# Patient Record
Sex: Male | Born: 1960 | Race: White | Hispanic: No | Marital: Single | Smoking: Light tobacco smoker
Health system: Southern US, Community
[De-identification: ages and names within clinical notes are randomized; demographics above are authoritative.]

## PROBLEM LIST (undated history)

## (undated) DIAGNOSIS — E119 Type 2 diabetes mellitus without complications: Secondary | ICD-10-CM

## (undated) DIAGNOSIS — I6529 Occlusion and stenosis of unspecified carotid artery: Secondary | ICD-10-CM

## (undated) DIAGNOSIS — I10 Essential (primary) hypertension: Secondary | ICD-10-CM

## (undated) DIAGNOSIS — J449 Chronic obstructive pulmonary disease, unspecified: Secondary | ICD-10-CM

## (undated) HISTORY — DX: Occlusion and stenosis of unspecified carotid artery: I65.29

---

## 1998-03-30 ENCOUNTER — Encounter: Admission: RE | Admit: 1998-03-30 | Discharge: 1998-03-30 | Payer: Self-pay | Admitting: *Deleted

## 2002-03-05 HISTORY — PX: LEG SURGERY: SHX1003

## 2013-09-21 ENCOUNTER — Emergency Department (HOSPITAL_COMMUNITY): Payer: Self-pay

## 2013-09-21 ENCOUNTER — Encounter (HOSPITAL_COMMUNITY): Payer: Self-pay | Admitting: Emergency Medicine

## 2013-09-21 ENCOUNTER — Inpatient Hospital Stay (HOSPITAL_COMMUNITY)
Admission: EM | Admit: 2013-09-21 | Discharge: 2013-09-28 | DRG: 913 | Disposition: A | Discharge status: Home / Self Care | Payer: Self-pay | Attending: General Surgery | Admitting: General Surgery

## 2013-09-21 DIAGNOSIS — I7771 Dissection of carotid artery: Secondary | ICD-10-CM | POA: Diagnosis present

## 2013-09-21 DIAGNOSIS — S15009A Unspecified injury of unspecified carotid artery, initial encounter: Principal | ICD-10-CM | POA: Diagnosis present

## 2013-09-21 DIAGNOSIS — Y92410 Unspecified street and highway as the place of occurrence of the external cause: Secondary | ICD-10-CM

## 2013-09-21 DIAGNOSIS — F3289 Other specified depressive episodes: Secondary | ICD-10-CM | POA: Diagnosis present

## 2013-09-21 DIAGNOSIS — E119 Type 2 diabetes mellitus without complications: Secondary | ICD-10-CM | POA: Diagnosis present

## 2013-09-21 DIAGNOSIS — F329 Major depressive disorder, single episode, unspecified: Secondary | ICD-10-CM | POA: Diagnosis present

## 2013-09-21 DIAGNOSIS — Z794 Long term (current) use of insulin: Secondary | ICD-10-CM

## 2013-09-21 DIAGNOSIS — F17200 Nicotine dependence, unspecified, uncomplicated: Secondary | ICD-10-CM | POA: Diagnosis present

## 2013-09-21 DIAGNOSIS — R739 Hyperglycemia, unspecified: Secondary | ICD-10-CM

## 2013-09-21 DIAGNOSIS — J4489 Other specified chronic obstructive pulmonary disease: Secondary | ICD-10-CM | POA: Diagnosis present

## 2013-09-21 DIAGNOSIS — R05 Cough: Secondary | ICD-10-CM | POA: Diagnosis present

## 2013-09-21 DIAGNOSIS — F411 Generalized anxiety disorder: Secondary | ICD-10-CM | POA: Diagnosis present

## 2013-09-21 DIAGNOSIS — J449 Chronic obstructive pulmonary disease, unspecified: Secondary | ICD-10-CM | POA: Diagnosis present

## 2013-09-21 DIAGNOSIS — R059 Cough, unspecified: Secondary | ICD-10-CM | POA: Diagnosis present

## 2013-09-21 DIAGNOSIS — Z6841 Body Mass Index (BMI) 40.0 and over, adult: Secondary | ICD-10-CM

## 2013-09-21 DIAGNOSIS — S20211A Contusion of right front wall of thorax, initial encounter: Secondary | ICD-10-CM

## 2013-09-21 HISTORY — DX: Type 2 diabetes mellitus without complications: E11.9

## 2013-09-21 HISTORY — DX: Chronic obstructive pulmonary disease, unspecified: J44.9

## 2013-09-21 HISTORY — DX: Essential (primary) hypertension: I10

## 2013-09-21 LAB — CBC
HCT: 45.7 % (ref 39.0–52.0)
Hemoglobin: 14.3 g/dL (ref 13.0–17.0)
MCH: 28.8 pg (ref 26.0–34.0)
MCHC: 31.3 g/dL (ref 30.0–36.0)
MCV: 92 fL (ref 78.0–100.0)
PLATELETS: 304 10*3/uL (ref 150–400)
RBC: 4.97 MIL/uL (ref 4.22–5.81)
RDW: 14.9 % (ref 11.5–15.5)
WBC: 10.8 10*3/uL — ABNORMAL HIGH (ref 4.0–10.5)

## 2013-09-21 LAB — COMPREHENSIVE METABOLIC PANEL
ALT: 31 U/L (ref 0–53)
AST: 29 U/L (ref 0–37)
Albumin: 3.7 g/dL (ref 3.5–5.2)
Alkaline Phosphatase: 94 U/L (ref 39–117)
Anion gap: 19 — ABNORMAL HIGH (ref 5–15)
BUN: 20 mg/dL (ref 6–23)
CHLORIDE: 96 meq/L (ref 96–112)
CO2: 22 mEq/L (ref 19–32)
Calcium: 9.9 mg/dL (ref 8.4–10.5)
Creatinine, Ser: 0.89 mg/dL (ref 0.50–1.35)
GLUCOSE: 389 mg/dL — AB (ref 70–99)
Potassium: 4 mEq/L (ref 3.7–5.3)
Sodium: 137 mEq/L (ref 137–147)
Total Bilirubin: <0.2 mg/dL — ABNORMAL LOW (ref 0.3–1.2)
Total Protein: 7.6 g/dL (ref 6.0–8.3)

## 2013-09-21 LAB — PROTIME-INR
INR: 0.98 (ref 0.00–1.49)
Prothrombin Time: 13 seconds (ref 11.6–15.2)

## 2013-09-21 LAB — SAMPLE TO BLOOD BANK

## 2013-09-21 LAB — ETHANOL: Alcohol, Ethyl (B): <11 mg/dL (ref 0–11)

## 2013-09-21 MED ORDER — INSULIN DETEMIR 100 UNIT/ML ~~LOC~~ SOLN
50.0000 [IU] | Freq: Two times a day (BID) | SUBCUTANEOUS | Status: DC
  Administered 2013-09-22 – 2013-09-28 (×14): 50 [IU] via SUBCUTANEOUS
  Filled 2013-09-21 (×17): qty 0.5

## 2013-09-21 MED ORDER — POTASSIUM CHLORIDE IN NACL 20-0.9 MEQ/L-% IV SOLN
INTRAVENOUS | Status: DC
  Administered 2013-09-22: 100 mL/h via INTRAVENOUS
  Administered 2013-09-24: 1000 mL via INTRAVENOUS
  Filled 2013-09-21 (×4): qty 1000

## 2013-09-21 MED ORDER — PANTOPRAZOLE SODIUM 40 MG PO TBEC: 40.0000 mg | DELAYED_RELEASE_TABLET | Freq: Every day | ORAL | Status: DC

## 2013-09-21 MED ORDER — INSULIN ASPART PROT & ASPART (70-30 MIX) 100 UNIT/ML ~~LOC~~ SUSP
30.0000 [IU] | Freq: Two times a day (BID) | SUBCUTANEOUS | Status: DC
  Administered 2013-09-22 – 2013-09-28 (×13): 30 [IU] via SUBCUTANEOUS
  Filled 2013-09-21 (×3): qty 10

## 2013-09-21 MED ORDER — IOHEXOL 350 MG/ML SOLN
100.0000 mL | Freq: Once | INTRAVENOUS | Status: AC | PRN
  Administered 2013-09-21: 100 mL via INTRAVENOUS

## 2013-09-21 MED ORDER — ONDANSETRON HCL 4 MG PO TABS: 4.0000 mg | ORAL_TABLET | Freq: Four times a day (QID) | ORAL | Status: DC | PRN

## 2013-09-21 MED ORDER — HEPARIN (PORCINE) IN NACL 100-0.45 UNIT/ML-% IJ SOLN
2300.0000 [IU]/h | INTRAMUSCULAR | Status: DC
  Administered 2013-09-22: 2100 [IU]/h via INTRAVENOUS
  Administered 2013-09-22: 1900 [IU]/h via INTRAVENOUS
  Administered 2013-09-22: 1600 [IU]/h via INTRAVENOUS
  Administered 2013-09-23 – 2013-09-27 (×6): 2300 [IU]/h via INTRAVENOUS
  Filled 2013-09-21 (×15): qty 250

## 2013-09-21 MED ORDER — ONDANSETRON HCL 4 MG/2ML IJ SOLN: 4.0000 mg | Freq: Four times a day (QID) | INTRAMUSCULAR | Status: DC | PRN

## 2013-09-21 MED ORDER — PANTOPRAZOLE SODIUM 40 MG IV SOLR
40.0000 mg | Freq: Every day | INTRAVENOUS | Status: DC
  Filled 2013-09-21: qty 40

## 2013-09-21 MED ORDER — SODIUM CHLORIDE 0.9 % IV BOLUS (SEPSIS)
1000.0000 mL | Freq: Once | INTRAVENOUS | Status: AC
  Administered 2013-09-21: 1000 mL via INTRAVENOUS

## 2013-09-21 MED ORDER — HYDROMORPHONE HCL PF 1 MG/ML IJ SOLN: 0.5000 mg | INTRAMUSCULAR | Status: DC | PRN

## 2013-09-21 MED ORDER — HYDROCODONE-ACETAMINOPHEN 5-325 MG PO TABS: 1.0000 | ORAL_TABLET | ORAL | Status: DC | PRN

## 2013-09-21 NOTE — ED Notes (Signed)
Patient transported to CT 

## 2013-09-21 NOTE — ED Notes (Signed)
Pt placed on cardiac monitoring.  
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
## 2013-09-21 NOTE — ED Notes (Signed)
Dr Wyatt at bedside.  
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
## 2013-09-21 NOTE — ED Provider Notes (Signed)
Medical screening examination/treatment/procedure(s) were conducted as a shared visit with non-physician practitioner(s) and myself.  I personally evaluated the patient during the encounter.   EKG Interpretation None        Rolan BuccoMelanie Oluwadarasimi Favor, MD 09/21/13 2324
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
## 2013-09-22 ENCOUNTER — Encounter (HOSPITAL_COMMUNITY): Payer: Self-pay | Admitting: *Deleted

## 2013-09-22 DIAGNOSIS — R05 Cough: Secondary | ICD-10-CM

## 2013-09-22 DIAGNOSIS — I7771 Dissection of carotid artery: Secondary | ICD-10-CM

## 2013-09-22 DIAGNOSIS — R059 Cough, unspecified: Secondary | ICD-10-CM

## 2013-09-22 LAB — CBC
HCT: 41.4 % (ref 39.0–52.0)
HEMOGLOBIN: 13 g/dL (ref 13.0–17.0)
MCH: 28.8 pg (ref 26.0–34.0)
MCHC: 31.4 g/dL (ref 30.0–36.0)
MCV: 91.6 fL (ref 78.0–100.0)
Platelets: 276 10*3/uL (ref 150–400)
RBC: 4.52 MIL/uL (ref 4.22–5.81)
RDW: 15.3 % (ref 11.5–15.5)
WBC: 10.4 10*3/uL (ref 4.0–10.5)

## 2013-09-22 LAB — HEPARIN LEVEL (UNFRACTIONATED)
HEPARIN UNFRACTIONATED: 0.12 [IU]/mL — AB (ref 0.30–0.70)
HEPARIN UNFRACTIONATED: 0.17 [IU]/mL — AB (ref 0.30–0.70)
Heparin Unfractionated: 0.21 [IU]/mL — ABNORMAL LOW (ref 0.30–0.70)

## 2013-09-22 LAB — PROTIME-INR
INR: 1 (ref 0.00–1.49)
PROTHROMBIN TIME: 13.2 s (ref 11.6–15.2)

## 2013-09-22 LAB — COMPREHENSIVE METABOLIC PANEL
ALT: 27 U/L (ref 0–53)
ANION GAP: 15 (ref 5–15)
AST: 22 U/L (ref 0–37)
Albumin: 3.5 g/dL (ref 3.5–5.2)
Alkaline Phosphatase: 73 U/L (ref 39–117)
BUN: 17 mg/dL (ref 6–23)
CO2: 26 mEq/L (ref 19–32)
Calcium: 8.7 mg/dL (ref 8.4–10.5)
Chloride: 102 mEq/L (ref 96–112)
Creatinine, Ser: 0.56 mg/dL (ref 0.50–1.35)
GFR calc Af Amer: >90 mL/min (ref >90)
GFR calc non Af Amer: >90 mL/min (ref >90)
Glucose, Bld: 213 mg/dL — ABNORMAL HIGH (ref 70–99)
Potassium: 3.6 mEq/L — ABNORMAL LOW (ref 3.7–5.3)
SODIUM: 143 meq/L (ref 137–147)
TOTAL PROTEIN: 7 g/dL (ref 6.0–8.3)
Total Bilirubin: <0.2 mg/dL — ABNORMAL LOW (ref 0.3–1.2)

## 2013-09-22 LAB — GLUCOSE, CAPILLARY
GLUCOSE-CAPILLARY: 187 mg/dL — AB (ref 70–99)
Glucose-Capillary: 195 mg/dL — ABNORMAL HIGH (ref 70–99)
Glucose-Capillary: 205 mg/dL — ABNORMAL HIGH (ref 70–99)

## 2013-09-22 LAB — APTT: aPTT: 40 seconds — ABNORMAL HIGH (ref 24–37)

## 2013-09-22 LAB — MRSA PCR SCREENING: MRSA BY PCR: NEGATIVE

## 2013-09-22 LAB — CDS SEROLOGY

## 2013-09-22 MED ORDER — WARFARIN SODIUM 10 MG PO TABS
10.0000 mg | ORAL_TABLET | Freq: Once | ORAL | Status: AC
  Administered 2013-09-22: 10 mg via ORAL
  Filled 2013-09-22 (×2): qty 1

## 2013-09-22 MED ORDER — AZITHROMYCIN 500 MG PO TABS
500.0000 mg | ORAL_TABLET | Freq: Every day | ORAL | Status: AC
  Administered 2013-09-22: 500 mg via ORAL
  Filled 2013-09-22 (×2): qty 1

## 2013-09-22 MED ORDER — WARFARIN - PHARMACIST DOSING INPATIENT
Freq: Every day | Status: DC
  Administered 2013-09-26 – 2013-09-27 (×2)

## 2013-09-22 MED ORDER — INSULIN ASPART 100 UNIT/ML ~~LOC~~ SOLN
0.0000 [IU] | Freq: Three times a day (TID) | SUBCUTANEOUS | Status: DC
  Administered 2013-09-22 (×2): 4 [IU] via SUBCUTANEOUS
  Administered 2013-09-22: 7 [IU] via SUBCUTANEOUS
  Administered 2013-09-23: 3 [IU] via SUBCUTANEOUS
  Administered 2013-09-23: 7 [IU] via SUBCUTANEOUS
  Administered 2013-09-24: 4 [IU] via SUBCUTANEOUS
  Administered 2013-09-24: 3 [IU] via SUBCUTANEOUS
  Administered 2013-09-25 (×2): 4 [IU] via SUBCUTANEOUS
  Administered 2013-09-26 – 2013-09-27 (×3): 7 [IU] via SUBCUTANEOUS
  Administered 2013-09-27 – 2013-09-28 (×2): 4 [IU] via SUBCUTANEOUS

## 2013-09-22 MED ORDER — HYDROMORPHONE HCL PF 1 MG/ML IJ SOLN: 0.5000 mg | INTRAMUSCULAR | Status: DC | PRN

## 2013-09-22 MED ORDER — AZITHROMYCIN 250 MG PO TABS
250.0000 mg | ORAL_TABLET | Freq: Every day | ORAL | Status: AC
  Administered 2013-09-23 – 2013-09-26 (×4): 250 mg via ORAL
  Filled 2013-09-22 (×5): qty 1

## 2013-09-22 MED ORDER — PNEUMOCOCCAL VAC POLYVALENT 25 MCG/0.5ML IJ INJ
0.5000 mL | INJECTION | INTRAMUSCULAR | Status: AC
  Administered 2013-09-24: 0.5 mL via INTRAMUSCULAR
  Filled 2013-09-22: qty 0.5

## 2013-09-22 MED ORDER — TRAMADOL HCL 50 MG PO TABS
50.0000 mg | ORAL_TABLET | Freq: Four times a day (QID) | ORAL | Status: DC | PRN
  Administered 2013-09-23 – 2013-09-26 (×2): 50 mg via ORAL
  Filled 2013-09-22: qty 1
  Filled 2013-09-22: qty 2
  Filled 2013-09-22 (×2): qty 1

## 2013-09-22 NOTE — Progress Notes (Signed)
Patient seen and examined.  Agree with PA's note.  

## 2013-09-22 NOTE — ED Notes (Signed)
Report called to Robert, RN.

## 2013-09-22 NOTE — Progress Notes (Signed)
Pt transferred to 3s14 per orders
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
## 2013-09-22 NOTE — Progress Notes (Signed)
ANTICOAGULATION CONSULT NOTE - Follow Up Consult  Pharmacy Consult for heparin Indication: dissected carotid artery  Allergies  Allergen Reactions  . Shellfish Allergy Nausea And Vomiting    Patient Measurements: Height: 5\' 9"  (175.3 cm) Weight: 360 lb 10.8 oz (163.6 kg) IBW/kg (Calculated) : 70.7 Heparin Dosing Weight: 110 kg  Vital Signs: Temp: 98.5 F (36.9 C) (07/21 1152) Temp src: Oral (07/21 1152) BP: 128/64 mmHg (07/21 1152) Pulse Rate: 80 (07/21 1152)  Labs:  Recent Labs  09/21/13 1844 09/22/13 0535 09/22/13 0815  HGB 14.3 13.0  --   HCT 45.7 41.4  --   PLT 304 276  --   APTT  --  40*  --   LABPROT 13.0 13.2  --   INR 0.98 1.00  --   HEPARINUNFRC  --  0.12* 0.17*  CREATININE 0.89 0.56  --     Estimated Creatinine Clearance: 163 ml/min (by C-G formula based on Cr of 0.56).   Medications:  Scheduled:  . [START ON 09/23/2013] azithromycin  250 mg Oral Daily  . insulin aspart  0-20 Units Subcutaneous TID WC  . insulin aspart protamine- aspart  30 Units Subcutaneous BID WC  . insulin detemir  50 Units Subcutaneous BID  . [START ON 09/23/2013] pneumococcal 23 valent vaccine  0.5 mL Intramuscular Tomorrow-1000  . warfarin  10 mg Oral ONCE-1800  . Warfarin - Pharmacist Dosing Inpatient   Does not apply q1800   Infusions:  . 0.9 % NaCl with KCl 20 mEq / L 10 mL/hr (09/22/13 0735)  . heparin 1,900 Units/hr (09/22/13 1003)    Assessment: 53 yo male with dissected carotid artery is currently on subtherapeutic heparin.  Heparin level is 0.21 Goal of Therapy:  Heparin level 0.3-0.7 units/ml Monitor platelets by anticoagulation protocol: Yes   Plan:  1) Increase heparin to 2100 units/hr.  2) Check a 6hr heparin level after drip rate is changed.  Teisha Trowbridge, Tsz-Yin 09/22/2013,3:33 PM
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
## 2013-09-22 NOTE — Progress Notes (Addendum)
UR completed.  Note pt's need for Coumadin at d/c and the subsequent monitoring.  He reported to the ED Case Mgr that his pcp is Dr[facility] D[facility] [place] [phone]). I called over there to discuss cost of INR monitoring and they stated that they do not see patients that require Coumadin because they are not able to monitor and manage it.  The patient will have to use the [facility] on [address]. The typical cost is $20/visit but he will have access to pharmacy and social work to assist with further resources. Will make appointment with further direction from medical team regarding timing of the INR checks.    Carlyle LipaMichelle Keasha Malkiewicz, RN BSN MHA CCM Trauma/Neuro ICU Case Manager [phone]
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
## 2013-09-22 NOTE — Progress Notes (Signed)
Unit social worker received consult to assess for INR checks every 3-6 months. CSW will pass this information along to the Trauma SW and Case Manager who will follow up with patient. Unit social worker signing off at this time.   Maree KrabbeLindsay Philopateer Strine, MSW, Theresia MajorsLCSWA [phone]

## 2013-09-23 ENCOUNTER — Telehealth: Payer: Self-pay | Admitting: Vascular Surgery

## 2013-09-23 LAB — PROTIME-INR
INR: 1.01 (ref 0.00–1.49)
PROTHROMBIN TIME: 13.3 s (ref 11.6–15.2)

## 2013-09-23 LAB — GLUCOSE, CAPILLARY
GLUCOSE-CAPILLARY: 222 mg/dL — AB (ref 70–99)
GLUCOSE-CAPILLARY: 230 mg/dL — AB (ref 70–99)
GLUCOSE-CAPILLARY: 121 mg/dL — AB (ref 70–99)
Glucose-Capillary: 142 mg/dL — ABNORMAL HIGH (ref 70–99)
Glucose-Capillary: 234 mg/dL — ABNORMAL HIGH (ref 70–99)
Glucose-Capillary: 214 mg/dL — ABNORMAL HIGH (ref 70–99)

## 2013-09-23 LAB — CBC
HCT: 43.6 % (ref 39.0–52.0)
HEMOGLOBIN: 13.5 g/dL (ref 13.0–17.0)
MCH: 28.6 pg (ref 26.0–34.0)
MCHC: 31 g/dL (ref 30.0–36.0)
MCV: 92.4 fL (ref 78.0–100.0)
Platelets: 270 10*3/uL (ref 150–400)
RBC: 4.72 MIL/uL (ref 4.22–5.81)
RDW: 14.8 % (ref 11.5–15.5)
WBC: 8.7 10*3/uL (ref 4.0–10.5)

## 2013-09-23 LAB — HEPARIN LEVEL (UNFRACTIONATED): Heparin Unfractionated: 0.66 [IU]/mL (ref 0.30–0.70)

## 2013-09-23 MED ORDER — IRBESARTAN 300 MG PO TABS
300.0000 mg | ORAL_TABLET | Freq: Every day | ORAL | Status: DC
  Administered 2013-09-23 – 2013-09-28 (×5): 300 mg via ORAL
  Filled 2013-09-23 (×7): qty 1

## 2013-09-23 MED ORDER — HYDROCHLOROTHIAZIDE 25 MG PO TABS
25.0000 mg | ORAL_TABLET | Freq: Every day | ORAL | Status: DC
  Administered 2013-09-23 – 2013-09-28 (×6): 25 mg via ORAL
  Filled 2013-09-23 (×8): qty 1

## 2013-09-23 MED ORDER — AMLODIPINE BESYLATE 10 MG PO TABS
10.0000 mg | ORAL_TABLET | Freq: Every day | ORAL | Status: DC
  Administered 2013-09-23 – 2013-09-28 (×5): 10 mg via ORAL
  Filled 2013-09-23 (×6): qty 1

## 2013-09-23 MED ORDER — WARFARIN SODIUM 5 MG PO TABS
10.0000 mg | ORAL_TABLET | Freq: Once | ORAL | Status: AC
  Administered 2013-09-23: 10 mg via ORAL
  Filled 2013-09-23: qty 1
  Filled 2013-09-23: qty 2

## 2013-09-23 MED ORDER — SERTRALINE HCL 100 MG PO TABS
100.0000 mg | ORAL_TABLET | Freq: Two times a day (BID) | ORAL | Status: DC
  Administered 2013-09-23 – 2013-09-28 (×11): 100 mg via ORAL
  Filled 2013-09-23 (×13): qty 1

## 2013-09-23 MED ORDER — BUSPIRONE HCL 10 MG PO TABS
20.0000 mg | ORAL_TABLET | Freq: Three times a day (TID) | ORAL | Status: DC
  Administered 2013-09-23 – 2013-09-28 (×16): 20 mg via ORAL
  Filled 2013-09-23 (×19): qty 2

## 2013-09-23 MED ORDER — WARFARIN VIDEO
Freq: Once | Status: AC
  Administered 2013-09-23: 17:00:00

## 2013-09-23 MED ORDER — COUMADIN BOOK
Freq: Once | Status: AC
  Administered 2013-09-23: 17:00:00
  Filled 2013-09-23 (×2): qty 1

## 2013-09-23 MED ORDER — LISINOPRIL 20 MG PO TABS
40.0000 mg | ORAL_TABLET | Freq: Every day | ORAL | Status: DC
  Administered 2013-09-23 – 2013-09-28 (×5): 40 mg via ORAL
  Filled 2013-09-23: qty 1
  Filled 2013-09-23 (×5): qty 2

## 2013-09-23 MED ORDER — GLIPIZIDE 5 MG PO TABS
10.0000 mg | ORAL_TABLET | Freq: Two times a day (BID) | ORAL | Status: DC
  Administered 2013-09-23 – 2013-09-28 (×11): 10 mg via ORAL
  Filled 2013-09-23: qty 1
  Filled 2013-09-23 (×4): qty 2
  Filled 2013-09-23: qty 1
  Filled 2013-09-23 (×6): qty 2

## 2013-09-23 MED ORDER — TRAZODONE HCL 100 MG PO TABS
300.0000 mg | ORAL_TABLET | Freq: Every day | ORAL | Status: DC
  Administered 2013-09-23 – 2013-09-27 (×5): 300 mg via ORAL
  Filled 2013-09-23 (×2): qty 3
  Filled 2013-09-23: qty 2
  Filled 2013-09-23 (×3): qty 3

## 2013-09-23 MED ORDER — FENOFIBRATE 54 MG PO TABS
54.0000 mg | ORAL_TABLET | Freq: Every day | ORAL | Status: DC
  Administered 2013-09-24 – 2013-09-28 (×5): 54 mg via ORAL
  Filled 2013-09-23 (×7): qty 1

## 2013-09-23 MED ORDER — LEVOTHYROXINE SODIUM 50 MCG PO TABS
50.0000 ug | ORAL_TABLET | Freq: Every day | ORAL | Status: DC
  Administered 2013-09-24 – 2013-09-28 (×5): 50 ug via ORAL
  Filled 2013-09-23 (×6): qty 1

## 2013-09-23 NOTE — Progress Notes (Signed)
ANTICOAGULATION CONSULT NOTE - Follow Up Consult  Pharmacy Consult for Heparin  Indication: Carotid dissection   Allergies  Allergen Reactions  . Shellfish Allergy Nausea And Vomiting    Patient Measurements: Height: 5\' 9"  (175.3 cm) Weight: 360 lb 10.8 oz (163.6 kg) IBW/kg (Calculated) : 70.7 Heparin Dosing Weight: 110 kg  Vital Signs: Temp: 99.3 F (37.4 C) (07/21 2334) Temp src: Oral (07/21 2334) BP: 149/67 mmHg (07/21 2334) Pulse Rate: 76 (07/21 2334)  Labs:  Recent Labs  09/21/13 1844  09/22/13 0535 09/22/13 0815 09/22/13 1600 09/22/13 2240  HGB 14.3  --  13.0  --   --   --   HCT 45.7  --  41.4  --   --   --   PLT 304  --  276  --   --   --   APTT  --   --  40*  --   --   --   LABPROT 13.0  --  13.2  --   --   --   INR 0.98  --  1.00  --   --   --   HEPARINUNFRC  --   < > 0.12* 0.17* 0.21* <0.10*  CREATININE 0.89  --  0.56  --   --   --   < > = values in this interval not displayed.  Estimated Creatinine Clearance: 163 ml/min (by C-G formula based on Cr of 0.56).   Medications:  Heparin 2100 units/hr  Assessment: Sub-therapeutic heparin level  Per RN, pt states heparin was off for a period of time on day shift, but heparin has been running since nigh shift RN has been here. Will still increase rate with low level, but will be more conservative given unknown time of being off.   Goal of Therapy:  Heparin level 0.3-0.7 units/ml Monitor platelets by anticoagulation protocol: Yes   Plan:  -Increase heparin to 2300 units/hr -0630 HL -Daily CBC/HL -Monitor for bleeding  Abran DukeLedford, Deetya Drouillard 09/23/2013,12:31 AM

## 2013-09-23 NOTE — Consult Note (Addendum)
Pt awake and alert.  No neuro deficits.  On heparin.  Coumadin started.  Can be discharged on therapeutic lovenox if not cost prohibitive otherwise needs to stay on heparin until INR 2-3.  We will arrange follow up carotid duplex in 6 weeks.  INR 1 today  Fabienne Brunsharles Mariamawit Depaoli, MD [facility] [place] Office: [phone] Pager: [phone]
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
## 2013-09-23 NOTE — Telephone Encounter (Addendum)
Message copied by Rosalyn ChartersOUX, BONNIE A on Wed Sep 23, 2013  9:45 AM ------      Message from: Fabienne BrunsFIELDS, CHARLES E      Created: Wed Sep 23, 2013  8:14 AM       Pt needs follow up carotid duplex and office visit in 6 weeks            Fabienne Brunsharles Fields ------  unable to reach patient by phone, mailed appt. letter notifying him of fu appt. with dr. Darrick Pennafields on 11-05-13

## 2013-09-24 DIAGNOSIS — J449 Chronic obstructive pulmonary disease, unspecified: Secondary | ICD-10-CM | POA: Insufficient documentation

## 2013-09-24 DIAGNOSIS — E119 Type 2 diabetes mellitus without complications: Secondary | ICD-10-CM | POA: Insufficient documentation

## 2013-09-24 DIAGNOSIS — I10 Essential (primary) hypertension: Secondary | ICD-10-CM | POA: Insufficient documentation

## 2013-09-24 LAB — PROTIME-INR
INR: 1.25 (ref 0.00–1.49)
Prothrombin Time: 15.7 seconds — ABNORMAL HIGH (ref 11.6–15.2)

## 2013-09-24 LAB — CBC
HCT: 42.1 % (ref 39.0–52.0)
Hemoglobin: 13.8 g/dL (ref 13.0–17.0)
MCH: 29.7 pg (ref 26.0–34.0)
MCHC: 32.8 g/dL (ref 30.0–36.0)
MCV: 90.5 fL (ref 78.0–100.0)
PLATELETS: 254 10*3/uL (ref 150–400)
RBC: 4.65 MIL/uL (ref 4.22–5.81)
RDW: 15.1 % (ref 11.5–15.5)
WBC: 10.6 10*3/uL — AB (ref 4.0–10.5)

## 2013-09-24 LAB — GLUCOSE, CAPILLARY
Glucose-Capillary: 120 mg/dL — ABNORMAL HIGH (ref 70–99)
Glucose-Capillary: 196 mg/dL — ABNORMAL HIGH (ref 70–99)
Glucose-Capillary: 149 mg/dL — ABNORMAL HIGH (ref 70–99)
Glucose-Capillary: 154 mg/dL — ABNORMAL HIGH (ref 70–99)

## 2013-09-24 LAB — HEPARIN LEVEL (UNFRACTIONATED): HEPARIN UNFRACTIONATED: 0.69 [IU]/mL (ref 0.30–0.70)

## 2013-09-24 MED ORDER — WARFARIN SODIUM 7.5 MG PO TABS
12.5000 mg | ORAL_TABLET | Freq: Once | ORAL | Status: AC
  Administered 2013-09-24: 12.5 mg via ORAL
  Filled 2013-09-24 (×2): qty 1
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
## 2013-09-24 NOTE — Progress Notes (Signed)
Neuro exam without acute changes. Await INR. Patient examined and I agree with the assessment and plan  Violeta GelinasBurke Aarya Quebedeaux, MD, MPH, FACS Trauma: [phone] General Surgery: [phone]  09/24/2013 9:44 AM

## 2013-09-24 NOTE — ED Notes (Signed)
SBIRT completed.  Pt denies drug/alcohol use.

## 2013-09-24 NOTE — Progress Notes (Signed)
Clinical Social Work Department BRIEF PSYCHOSOCIAL ASSESSMENT 09/24/2013  Patient:  Jason Hall,Jason Hall     Account Number:  [id number]     Admit date:  09/21/2013  Clinical Social Worker:  Illene SilverRAKE,Kena Limon, LCSW  Date/Time:  09/24/2013 03:34 PM  Referred by:  CSW  Date Referred:  09/24/2013 Referred for  Psychosocial assessment   Other Referral:   Interview type:  Family Other interview type:    PSYCHOSOCIAL DATA Living Status:  FAMILY Admitted from facility:   Level of care:   Primary support name:  margie Kamau Primary support relationship to patient:  FAMILY Degree of support available:   Fair.  Pt is the primary caregiver for his mother, however, he reports that there are several other family members who are available to help him with her care.  Family also available to help pt with his care needs at d/c, if necessary.    CURRENT CONCERNS Current Concerns  None Noted   Other Concerns:    SOCIAL WORK ASSESSMENT / PLAN CSW spoke with pt re: role of CSW/dcp.  Pt lives at home with his mother and was independent with ADLs pta. Pt reports that he cares for his mother, but has someone "lined up" to help with meal prep at d/c, because he doesn't want to cut himself since he is on blood thinners. Pt walks with a cane at his baseline, and is unemployed/uninsured, with his disability pending.  Pt reports good family support.   Assessment/plan status:  No Further Intervention Required Other assessment/ plan:   Information/referral to community resources:    PATIENT'S/FAMILY'S RESPONSE TO PLAN OF CARE: Pt very happy that he may be going home soon.  Anxious to meet with his disability worker on 8/1.
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
## 2013-09-25 LAB — GLUCOSE, CAPILLARY
GLUCOSE-CAPILLARY: 108 mg/dL — AB (ref 70–99)
GLUCOSE-CAPILLARY: 197 mg/dL — AB (ref 70–99)
GLUCOSE-CAPILLARY: 183 mg/dL — AB (ref 70–99)
Glucose-Capillary: 215 mg/dL — ABNORMAL HIGH (ref 70–99)

## 2013-09-25 LAB — CBC
HCT: 41.4 % (ref 39.0–52.0)
HEMOGLOBIN: 13.2 g/dL (ref 13.0–17.0)
MCH: 28.9 pg (ref 26.0–34.0)
MCHC: 31.9 g/dL (ref 30.0–36.0)
MCV: 90.8 fL (ref 78.0–100.0)
Platelets: 281 10*3/uL (ref 150–400)
RBC: 4.56 MIL/uL (ref 4.22–5.81)
RDW: 15.3 % (ref 11.5–15.5)
WBC: 8.9 10*3/uL (ref 4.0–10.5)

## 2013-09-25 LAB — HEPARIN LEVEL (UNFRACTIONATED): Heparin Unfractionated: 0.5 IU/mL (ref 0.30–0.70)

## 2013-09-25 LAB — PROTIME-INR
INR: 1.53 — AB (ref 0.00–1.49)
PROTHROMBIN TIME: 18.4 s — AB (ref 11.6–15.2)

## 2013-09-25 MED ORDER — WARFARIN SODIUM 7.5 MG PO TABS
12.5000 mg | ORAL_TABLET | Freq: Once | ORAL | Status: AC
  Administered 2013-09-25: 12.5 mg via ORAL
  Filled 2013-09-25 (×2): qty 1

## 2013-09-25 NOTE — Clinical Social Work Note (Signed)
Clinical Social Worker will sign off for now as social work intervention is no longer needed. Please consult us again if new need arises.  Jesse Samaiya Awadallah, LCSW [phone]  
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
## 2013-09-25 NOTE — Progress Notes (Signed)
Awaiting anticoagulation.  This patient has been seen and I agree with the findings and treatment plan.  Marta LamasJames O. Gae BonWyatt, III, MD, FACS [phone] (pager) [phone] (direct pager) Trauma Surgeon
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
## 2013-09-26 LAB — CBC
HEMATOCRIT: 43.8 % (ref 39.0–52.0)
Hemoglobin: 13.9 g/dL (ref 13.0–17.0)
MCH: 28.9 pg (ref 26.0–34.0)
MCHC: 31.7 g/dL (ref 30.0–36.0)
MCV: 91.1 fL (ref 78.0–100.0)
Platelets: 283 10*3/uL (ref 150–400)
RBC: 4.81 MIL/uL (ref 4.22–5.81)
RDW: 15.3 % (ref 11.5–15.5)
WBC: 9.7 10*3/uL (ref 4.0–10.5)

## 2013-09-26 LAB — GLUCOSE, CAPILLARY
Glucose-Capillary: 106 mg/dL — ABNORMAL HIGH (ref 70–99)
Glucose-Capillary: 237 mg/dL — ABNORMAL HIGH (ref 70–99)
Glucose-Capillary: 220 mg/dL — ABNORMAL HIGH (ref 70–99)

## 2013-09-26 LAB — HEPARIN LEVEL (UNFRACTIONATED): Heparin Unfractionated: 0.69 IU/mL (ref 0.30–0.70)

## 2013-09-26 LAB — PROTIME-INR
INR: 1.99 — ABNORMAL HIGH (ref 0.00–1.49)
PROTHROMBIN TIME: 22.6 s — AB (ref 11.6–15.2)

## 2013-09-26 MED ORDER — WARFARIN SODIUM 5 MG PO TABS
12.5000 mg | ORAL_TABLET | Freq: Once | ORAL | Status: AC
  Administered 2013-09-26: 12.5 mg via ORAL
  Filled 2013-09-26: qty 1

## 2013-09-26 MED ORDER — ACETAMINOPHEN 325 MG PO TABS
650.0000 mg | ORAL_TABLET | Freq: Four times a day (QID) | ORAL | Status: DC | PRN
  Administered 2013-09-26 – 2013-09-27 (×2): 650 mg via ORAL
  Filled 2013-09-26 (×2): qty 2
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
## 2013-09-26 NOTE — Progress Notes (Signed)
I have interviewed and examined this patient. Grossly intact neurologically. I agree with the assessment and treatment plan outlined by Ms. Earl Galasborne PA.  he needs to ambulate more. Home once INR therapeutic for 2 days. [place] [facility] [place].  Angelia MouldHaywood M. Derrell LollingIngram, M.D., [facility]
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
## 2013-09-27 LAB — GLUCOSE, CAPILLARY
GLUCOSE-CAPILLARY: 210 mg/dL — AB (ref 70–99)
Glucose-Capillary: 152 mg/dL — ABNORMAL HIGH (ref 70–99)
Glucose-Capillary: 119 mg/dL — ABNORMAL HIGH (ref 70–99)
Glucose-Capillary: 110 mg/dL — ABNORMAL HIGH (ref 70–99)

## 2013-09-27 LAB — CBC
HCT: 41.5 % (ref 39.0–52.0)
Hemoglobin: 13.1 g/dL (ref 13.0–17.0)
MCH: 28.8 pg (ref 26.0–34.0)
MCHC: 31.6 g/dL (ref 30.0–36.0)
MCV: 91.2 fL (ref 78.0–100.0)
Platelets: 295 10*3/uL (ref 150–400)
RBC: 4.55 MIL/uL (ref 4.22–5.81)
RDW: 15.6 % — ABNORMAL HIGH (ref 11.5–15.5)
WBC: 10 10*3/uL (ref 4.0–10.5)

## 2013-09-27 LAB — HEPARIN LEVEL (UNFRACTIONATED): Heparin Unfractionated: 0.82 IU/mL — ABNORMAL HIGH (ref 0.30–0.70)

## 2013-09-27 LAB — PROTIME-INR
INR: 2.57 — ABNORMAL HIGH (ref 0.00–1.49)
Prothrombin Time: 27.6 seconds — ABNORMAL HIGH (ref 11.6–15.2)

## 2013-09-27 MED ORDER — WARFARIN SODIUM 7.5 MG PO TABS
7.5000 mg | ORAL_TABLET | Freq: Once | ORAL | Status: AC
  Administered 2013-09-27: 7.5 mg via ORAL
  Filled 2013-09-27: qty 1
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
## 2013-09-27 NOTE — Progress Notes (Signed)
Wilmon ArmsMatthew K. Corliss Skainssuei, MD, [facility]  General/ Trauma Surgery  09/27/2013 12:04 PM
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
## 2013-09-28 LAB — PROTIME-INR
INR: 2.48 — ABNORMAL HIGH (ref 0.00–1.49)
PROTHROMBIN TIME: 26.8 s — AB (ref 11.6–15.2)

## 2013-09-28 LAB — CBC
HCT: 43.7 % (ref 39.0–52.0)
Hemoglobin: 13.8 g/dL (ref 13.0–17.0)
MCH: 28.9 pg (ref 26.0–34.0)
MCHC: 31.6 g/dL (ref 30.0–36.0)
MCV: 91.6 fL (ref 78.0–100.0)
Platelets: 286 10*3/uL (ref 150–400)
RBC: 4.77 MIL/uL (ref 4.22–5.81)
RDW: 15.6 % — ABNORMAL HIGH (ref 11.5–15.5)
WBC: 10 10*3/uL (ref 4.0–10.5)

## 2013-09-28 LAB — GLUCOSE, CAPILLARY
Glucose-Capillary: 92 mg/dL (ref 70–99)
Glucose-Capillary: 156 mg/dL — ABNORMAL HIGH (ref 70–99)
Glucose-Capillary: 114 mg/dL — ABNORMAL HIGH (ref 70–99)

## 2013-09-28 MED ORDER — TRAMADOL HCL 50 MG PO TABS: 50.0000 mg | ORAL_TABLET | Freq: Four times a day (QID) | ORAL | Status: DC | PRN

## 2013-09-28 MED ORDER — ACETAMINOPHEN 325 MG PO TABS: 650.0000 mg | ORAL_TABLET | Freq: Four times a day (QID) | ORAL | Status: AC | PRN

## 2013-09-28 MED ORDER — WARFARIN SODIUM 7.5 MG PO TABS
7.5000 mg | ORAL_TABLET | Freq: Once | ORAL | Status: AC
  Administered 2013-09-28: 7.5 mg via ORAL
  Filled 2013-09-28: qty 1

## 2013-09-28 MED ORDER — WARFARIN SODIUM 5 MG PO TABS: 7.5000 mg | ORAL_TABLET | Freq: Every day | ORAL | Status: DC

## 2013-09-28 NOTE — Progress Notes (Addendum)
Appointment made for patient to [facility] [address] For Wednesday July 29th at 0900. Pt's AVS updated with this information. He will return on Friday 7/31 for first INR check.   [facility] also [facility]. Only overrode system for $0 copay on Coumadin and Tramadol. All others will be at $3. Explained this to patient.

## 2013-09-28 NOTE — Progress Notes (Signed)
Reviewed discharge instructions with patient with emphasis on coumadin teaching.  Pt able to correctly tell me how to take coumadin and importance of taking each day, and lab draws.  States he understands.  Family here to pick up patient.  Left with belongings and all discharge papers and prescriptions.
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
## 2013-09-28 NOTE — Progress Notes (Signed)
[address] [place], [place], [place]  [id number].  Best phone number to reach patient [phone].

## 2013-09-28 NOTE — Discharge Instructions (Signed)
YOU ARE TO TAKE COUMADIN 1.5 TABLETS OR 7.5MG  AT BEDSIDE TONIGHT THROUGH Thursday NIGHT. YOU NEED TO FOLLOW UP IN THE [facility] ON Friday TO ESTABLISH CARE AND HAVE YOUR INR CHECKED.  FURTHER DOSING IS TO BE DONE BY THE [facility].     Information on my medicine - Coumadin   (Warfarin)  Why was Coumadin prescribed for you? Coumadin was prescribed for you because you have a blood clot or a medical condition that can cause an increased risk of forming blood clots. Blood clots can cause serious health problems by blocking the flow of blood to the heart, lung, or brain. Coumadin can prevent harmful blood clots from forming. As a reminder your indication for Coumadin is:   Select from menu  What test will check on my response to Coumadin? While on Coumadin (warfarin) you will need to have an INR test regularly to ensure that your dose is keeping you in the desired range. The INR (international normalized ratio) number is calculated from the result of the laboratory test called prothrombin time (PT).  If an INR APPOINTMENT HAS NOT ALREADY BEEN MADE FOR YOU please schedule an appointment to have this lab work done by your health care provider within 7 days. Your INR goal is usually a number between:  2 to 3 or your provider may give you a more narrow range like 2-2.5.  Ask your health care provider during an office visit what your goal INR is.  What  do you need to  know  About  COUMADIN? Take Coumadin (warfarin) exactly as prescribed by your healthcare provider about the same time each day.  DO NOT stop taking without talking to the doctor who prescribed the medication.  Stopping without other blood clot prevention medication to take the place of Coumadin may increase your risk of developing a new clot or stroke.  Get refills before you run out.  What do you do if you miss a dose? If you miss a dose, take it as soon as you remember on the same day then continue your regularly  scheduled regimen the next day.  Do not take two doses of Coumadin at the same time.  Important Safety Information A possible side effect of Coumadin (Warfarin) is an increased risk of bleeding. You should call your healthcare provider right away if you experience any of the following:   Bleeding from an injury or your nose that does not stop.   Unusual colored urine (red or dark brown) or unusual colored stools (red or black).   Unusual bruising for unknown reasons.   A serious fall or if you hit your head (even if there is no bleeding).  Some foods or medicines interact with Coumadin (warfarin) and might alter your response to warfarin. To help avoid this:   Eat a balanced diet, maintaining a consistent amount of Vitamin K.   Notify your provider about major diet changes you plan to make.   Avoid alcohol or limit your intake to 1 drink for women and 2 drinks for men per day. (1 drink is 5 oz. wine, 12 oz. beer, or 1.5 oz. liquor.)  Make sure that ANY health care provider who prescribes medication for you knows that you are taking Coumadin (warfarin).  Also make sure the healthcare provider who is monitoring your Coumadin knows when you have started a new medication including herbals and non-prescription products.  Coumadin (Warfarin)  Major Drug Interactions  Increased Warfarin Effect Decreased Warfarin Effect  Alcohol (large quantities) Antibiotics (esp. Septra/Bactrim, Flagyl, Cipro) Amiodarone (Cordarone) Aspirin (ASA) Cimetidine (Tagamet) Megestrol (Megace) NSAIDs (ibuprofen, naproxen, etc.) Piroxicam (Feldene) Propafenone (Rythmol SR) Propranolol (Inderal) Isoniazid (INH) Posaconazole (Noxafil) Barbiturates (Phenobarbital) Carbamazepine (Tegretol) Chlordiazepoxide (Librium) Cholestyramine (Questran) Griseofulvin Oral Contraceptives Rifampin Sucralfate (Carafate) Vitamin K   Coumadin (Warfarin) Major Herbal Interactions  Increased Warfarin Effect Decreased Warfarin  Effect  Garlic Ginseng Ginkgo biloba Coenzyme Q10 Green tea St. Johns wort    Coumadin (Warfarin) FOOD Interactions  Eat a consistent number of servings per week of foods HIGH in Vitamin K (1 serving =  cup)  Collards (cooked, or boiled & drained) Kale (cooked, or boiled & drained) Mustard greens (cooked, or boiled & drained) Parsley *serving size only =  cup Spinach (cooked, or boiled & drained) Swiss chard (cooked, or boiled & drained) Turnip greens (cooked, or boiled & drained)  Eat a consistent number of servings per week of foods MEDIUM-HIGH in Vitamin K (1 serving = 1 cup)  Asparagus (cooked, or boiled & drained) Broccoli (cooked, boiled & drained, or raw & chopped) Brussel sprouts (cooked, or boiled & drained) *serving size only =  cup Lettuce, raw (green leaf, endive, romaine) Spinach, raw Turnip greens, raw & chopped   These websites have more information on Coumadin (warfarin):  http://www.king-russell.com/; https://www.hines.net/;
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
## 2013-09-28 NOTE — Discharge Summary (Signed)
Toba Claudio, MD, MPH, FACS Trauma: [phone] General Surgery: [phone]  

## 2013-10-01 ENCOUNTER — Ambulatory Visit: Payer: Self-pay | Attending: Internal Medicine | Admitting: Internal Medicine

## 2013-10-01 ENCOUNTER — Ambulatory Visit: Payer: Self-pay | Admitting: Internal Medicine

## 2013-10-01 ENCOUNTER — Encounter: Payer: Self-pay | Admitting: Internal Medicine

## 2013-10-01 VITALS — BP 131/81 | HR 88 | Temp 97.9°F | Resp 20 | Ht 69.0 in | Wt 352.0 lb

## 2013-10-01 DIAGNOSIS — Z91013 Allergy to seafood: Secondary | ICD-10-CM | POA: Insufficient documentation

## 2013-10-01 DIAGNOSIS — R209 Unspecified disturbances of skin sensation: Secondary | ICD-10-CM | POA: Insufficient documentation

## 2013-10-01 DIAGNOSIS — I10 Essential (primary) hypertension: Secondary | ICD-10-CM | POA: Insufficient documentation

## 2013-10-01 DIAGNOSIS — J4489 Other specified chronic obstructive pulmonary disease: Secondary | ICD-10-CM | POA: Insufficient documentation

## 2013-10-01 DIAGNOSIS — F17200 Nicotine dependence, unspecified, uncomplicated: Secondary | ICD-10-CM | POA: Insufficient documentation

## 2013-10-01 DIAGNOSIS — E119 Type 2 diabetes mellitus without complications: Secondary | ICD-10-CM | POA: Insufficient documentation

## 2013-10-01 DIAGNOSIS — J449 Chronic obstructive pulmonary disease, unspecified: Secondary | ICD-10-CM | POA: Insufficient documentation

## 2013-10-01 DIAGNOSIS — I7771 Dissection of carotid artery: Secondary | ICD-10-CM | POA: Insufficient documentation

## 2013-10-01 DIAGNOSIS — Z7901 Long term (current) use of anticoagulants: Secondary | ICD-10-CM | POA: Insufficient documentation

## 2013-10-01 DIAGNOSIS — Z794 Long term (current) use of insulin: Secondary | ICD-10-CM | POA: Insufficient documentation

## 2013-10-01 LAB — POCT INR: INR: 2.9

## 2013-10-01 LAB — POCT GLYCOSYLATED HEMOGLOBIN (HGB A1C): Hemoglobin A1C: 8.3

## 2013-10-01 LAB — GLUCOSE, POCT (MANUAL RESULT ENTRY): POC Glucose: 209 mg/dl — AB (ref 70–99)

## 2013-10-01 MED ORDER — FREESTYLE SYSTEM KIT: 1.0000 | PACK | Status: DC | PRN

## 2013-10-01 MED ORDER — INSULIN DETEMIR 100 UNIT/ML ~~LOC~~ SOLN: 50.0000 [IU] | Freq: Two times a day (BID) | SUBCUTANEOUS | Status: DC

## 2013-10-01 MED ORDER — GLUCOSE BLOOD VI STRP: ORAL_STRIP | Status: DC

## 2013-10-01 MED ORDER — FREESTYLE LANCETS MISC: Status: DC
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
## 2013-10-01 NOTE — Progress Notes (Signed)
Patient presents to establish care HFU for carotid dissection C/O numbness right thigh and numbness and tingling left elbow since being in hospital Started coumadin 4 days ago
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
## 2013-10-08 ENCOUNTER — Encounter: Payer: Self-pay | Admitting: *Deleted

## 2013-10-08 ENCOUNTER — Ambulatory Visit: Payer: Self-pay | Attending: Internal Medicine

## 2013-10-08 ENCOUNTER — Ambulatory Visit: Payer: Self-pay

## 2013-10-08 ENCOUNTER — Ambulatory Visit: Payer: Self-pay | Attending: Internal Medicine | Admitting: *Deleted

## 2013-10-08 DIAGNOSIS — Z7901 Long term (current) use of anticoagulants: Secondary | ICD-10-CM

## 2013-10-08 LAB — POCT INR: INR: 2.9

## 2013-10-08 NOTE — Progress Notes (Signed)
Patient has brought DMV forms to be filled out. Forms received and placed in Jason Hall. Keck, NP box. [name],[name] [name], RN

## 2013-10-08 NOTE — Patient Instructions (Signed)
Continue to same medication dose as before. Return in one week.

## 2013-10-15 ENCOUNTER — Other Ambulatory Visit: Payer: Self-pay | Admitting: *Deleted

## 2013-10-15 ENCOUNTER — Ambulatory Visit: Payer: Self-pay | Attending: Internal Medicine | Admitting: Pharmacist

## 2013-10-15 DIAGNOSIS — I7771 Dissection of carotid artery: Secondary | ICD-10-CM

## 2013-10-15 DIAGNOSIS — Z7901 Long term (current) use of anticoagulants: Secondary | ICD-10-CM

## 2013-10-15 LAB — POCT INR: INR: 2.9

## 2013-10-15 MED ORDER — WARFARIN SODIUM 5 MG PO TABS: 7.5000 mg | ORAL_TABLET | Freq: Every day | ORAL | Status: DC

## 2013-10-15 NOTE — Patient Instructions (Signed)
Warfarin tablets  What is this medicine?  WARFARIN (WAR far in) is an anticoagulant. It is used to treat or prevent clots in the veins, arteries, lungs, or heart.  This medicine may be used for other purposes; ask your health care provider or pharmacist if you have questions.  COMMON BRAND NAME(S): Coumadin, Jantoven  What should I tell my health care provider before I take this medicine?  They need to know if you have any of these conditions:  -alcoholism  -anemia  -bleeding disorders  -cancer  -diabetes  -heart disease  -high blood pressure  -history of bleeding in the gastrointestinal tract  -history of stroke or other brain injury or disease  -kidney or liver disease  -protein C deficiency  -protein S deficiency  -psychosis or dementia  -recent injury, recent or planned surgery or procedure  -an unusual or allergic reaction to warfarin, other medicines, foods, dyes, or preservatives  -pregnant or trying to get pregnant  -breast-feeding  How should I use this medicine?  Take this medicine by mouth with a glass of water. Follow the directions on the prescription label. You can take this medicine with or without food. Take your medicine at the same time each day. Do not take it more often than directed. Do not stop taking except on your doctor's advice. Stopping this medicine may increase your risk of a blood clot. Be sure to refill your prescription before you run out of medicine.  If your doctor or healthcare professional calls to change your dose, write down the dose and any other instructions. Always read the dose and instructions back to him or her to make sure you understand them. Tell your doctor or healthcare professional what strength of tablets you have on hand. Ask how many tablets you should take to equal your new dose. Write the date on the new instructions and keep them near your medicine. If you are told to stop taking your medicine until your next blood test, call your doctor or healthcare  professional if you do not hear anything within 24 hours of the test to find out your new dose or when to restart your prior dose.  A special MedGuide will be given to you by the pharmacist with each prescription and refill. Be sure to read this information carefully each time.  Talk to your pediatrician regarding the use of this medicine in children. Special care may be needed.  Overdosage: If you think you have taken too much of this medicine contact a poison control center or emergency room at once.  NOTE: This medicine is only for you. Do not share this medicine with others.  What if I miss a dose?  It is important not to miss a dose. If you miss a dose, call your healthcare provider. Take the dose as soon as possible on the same day. If it is almost time for your next dose, take only that dose. Do not take double or extra doses to make up for a missed dose.  What may interact with this medicine?  Do not take this medicine with any of the following medications:  -agents that prevent or dissolve blood clots  -aspirin or other salicylates  -danshen  -dextrothyroxine  -mifepristone  -St. John's Wort  -red yeast rice  This medicine may also interact with the following medications:  -acetaminophen  -agents that lower cholesterol  -alcohol  -allopurinol  -amiodarone  -antibiotics or medicines for treating bacterial, fungal or viral infections  -azathioprine  -  barbiturate medicines for inducing sleep or treating seizures  -certain medicines for diabetes  -certain medicines for heart rhythm problems  -certain medicines for high blood pressure  -chloral hydrate  -cisapride  -disulfiram  -male hormones, including contraceptive or birth control pills  -general anesthetics  -herbal or dietary products like garlic, ginkgo, ginseng, green tea, or kava kava  -influenza virus vaccine  -male hormones  -medicines for mental depression or psychosis  -medicines for some types of cancer  -medicines for stomach  problems  -methylphenidate  -NSAIDs, medicines for pain and inflammation, like ibuprofen or naproxen  -propoxyphene  -quinidine, quinine  -raloxifene  -seizure or epilepsy medicine like carbamazepine, phenytoin, and valproic acid  -steroids like cortisone and prednisone  -tamoxifen  -thyroid medicine  -tramadol  -vitamin c, vitamin e, and vitamin K  -zafirlukast  -zileuton  This list may not describe all possible interactions. Give your health care provider a list of all the medicines, herbs, non-prescription drugs, or dietary supplements you use. Also tell them if you smoke, drink alcohol, or use illegal drugs. Some items may interact with your medicine.  What should I watch for while using this medicine?  Visit your doctor or health care professional for regular checks on your progress. You will need to have a blood test called a PT/INR regularly. The PT/INR blood test is done to make sure you are getting the right dose of this medicine. It is important to not miss your appointment for the blood tests. When you first start taking this medicine, these tests are done often. Once the correct dose is determined and you take your medicine properly, these tests can be done less often.  Notify your doctor or health care professional and seek emergency treatment if you develop breathing problems; changes in vision; chest pain; severe, sudden headache; pain, swelling, warmth in the leg; trouble speaking; sudden numbness or weakness of the face, arm or leg. These can be signs that your condition has gotten worse.  While you are taking this medicine, carry an identification card with your name, the name and dose of medicine(s) being used, and the name and phone number of your doctor or health care professional or person to contact in an emergency.  Do not start taking or stop taking any medicines or over-the-counter medicines except on the advice of your doctor or health care professional.  You should discuss your diet with  your doctor or health care professional. Do not make major changes in your diet. Vitamin K can affect how well this medicine works. Many foods contain vitamin K. It is important to eat a consistent amount of foods with vitamin K. Other foods with vitamin K that you should eat in consistent amounts are asparagus, basil, beef or pork liver, black eyed peas, broccoli, brussel sprouts, cabbage, chickpeas, cucumber with peel, green onions, green tea, okra, parsley, peas, thyme, and green leafy vegetables like beet greens, collard greens, endive, kale, mustard greens, spinach, turnip greens, watercress, or certain lettuces like green leaf or romaine.  This medicine can cause birth defects or bleeding in an unborn child. Women of childbearing age should use effective birth control while taking this medicine. If a woman becomes pregnant while taking this medicine, she should discuss the potential risks and her options with her health care professional.  Avoid sports and activities that might cause injury while you are using this medicine. Severe falls or injuries can cause unseen bleeding. Be careful when using sharp tools or knives. Consider using   an electric razor. Take special care brushing or flossing your teeth. Report any injuries, bruising, or red spots on the skin to your doctor or health care professional.  If you have an illness that causes vomiting, diarrhea, or fever for more than a few days, contact your doctor. Also check with your doctor if you are unable to eat for several days. These problems can change the effect of this medicine.  Even after you stop taking this medicine, it takes several days before your body recovers its normal ability to clot blood. Ask your doctor or health care professional how long you need to be careful. If you are going to have surgery or dental work, tell your doctor or health care professional that you have been taking this medicine.  What side effects may I notice from  receiving this medicine?  Side effects that you should report to your doctor or health care professional as soon as possible:  -back pain  -chills  -dizziness  -fever  -heavy menstrual bleeding or vaginal bleeding  -painful, blue, or purple toes  -painful, prolonged erection  -signs and symptoms of bleeding such as bloody or black, tarry stools; red or dark-brown urine; spitting up blood or brown material that looks like coffee grounds; red spots on the skin; unusual bruising or bleeding from the eye, gums, or nose-skin rash, itching or skin damage  -stomach pain  -unusually weak or tired  -yellowing of skin or eyes  Side effects that usually do not require medical attention (report to your doctor or health care professional if they continue or are bothersome):  -diarrhea  -hair loss  This list may not describe all possible side effects. Call your doctor for medical advice about side effects. You may report side effects to FDA at 1-800-FDA-1088.  Where should I keep my medicine?  Keep out of the reach of children.  Store at room temperature between 15 and 30 degrees C (59 and 86 degrees F). Protect from light. Throw away any unused medicine after the expiration date. Do not flush down the toilet.  NOTE: This sheet is a summary. It may not cover all possible information. If you have questions about this medicine, talk to your doctor, pharmacist, or health care provider.   2015, [employer]vier/[employer]. (2012-09-10 12:17:56)

## 2013-10-16 ENCOUNTER — Other Ambulatory Visit: Payer: Self-pay | Admitting: Internal Medicine

## 2013-10-16 MED ORDER — INSULIN GLARGINE 100 UNIT/ML SOLOSTAR PEN: 50.0000 [IU] | PEN_INJECTOR | Freq: Every day | SUBCUTANEOUS | Status: DC

## 2013-10-21 ENCOUNTER — Other Ambulatory Visit: Payer: Self-pay | Admitting: *Deleted

## 2013-10-21 DIAGNOSIS — I6529 Occlusion and stenosis of unspecified carotid artery: Secondary | ICD-10-CM

## 2013-11-04 ENCOUNTER — Encounter: Payer: Self-pay | Admitting: Vascular Surgery

## 2013-11-05 ENCOUNTER — Ambulatory Visit (INDEPENDENT_AMBULATORY_CARE_PROVIDER_SITE_OTHER): Payer: Self-pay | Admitting: Vascular Surgery

## 2013-11-05 ENCOUNTER — Other Ambulatory Visit: Payer: Self-pay | Admitting: Vascular Surgery

## 2013-11-05 ENCOUNTER — Ambulatory Visit (HOSPITAL_COMMUNITY)
Admission: RE | Admit: 2013-11-05 | Discharge: 2013-11-05 | Disposition: A | Discharge status: Home / Self Care | Payer: Self-pay | Source: Ambulatory Visit | Attending: Vascular Surgery | Admitting: Vascular Surgery

## 2013-11-05 ENCOUNTER — Encounter: Payer: Self-pay | Admitting: Vascular Surgery

## 2013-11-05 VITALS — BP 144/75 | HR 86 | Resp 16 | Ht 69.0 in | Wt 355.0 lb

## 2013-11-05 DIAGNOSIS — I6529 Occlusion and stenosis of unspecified carotid artery: Secondary | ICD-10-CM | POA: Insufficient documentation

## 2013-11-05 DIAGNOSIS — I7771 Dissection of carotid artery: Secondary | ICD-10-CM
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
## 2013-11-06 NOTE — Addendum Note (Signed)
Addended by: Sharee Pimple on: 11/06/2013 05:03 PM   Modules accepted: Orders

## 2013-11-16 ENCOUNTER — Ambulatory Visit: Payer: Self-pay | Attending: Internal Medicine | Admitting: Pharmacist

## 2013-11-16 DIAGNOSIS — Z5181 Encounter for therapeutic drug level monitoring: Secondary | ICD-10-CM | POA: Insufficient documentation

## 2013-11-16 DIAGNOSIS — I7771 Dissection of carotid artery: Secondary | ICD-10-CM | POA: Insufficient documentation

## 2013-11-16 DIAGNOSIS — Z7901 Long term (current) use of anticoagulants: Secondary | ICD-10-CM | POA: Insufficient documentation

## 2013-11-16 LAB — POCT INR: INR: 1.4

## 2013-11-16 MED ORDER — WARFARIN SODIUM 5 MG PO TABS: 7.5000 mg | ORAL_TABLET | Freq: Every day | ORAL | Status: DC

## 2013-11-23 ENCOUNTER — Ambulatory Visit: Payer: Self-pay | Attending: Internal Medicine | Admitting: Internal Medicine

## 2013-11-23 DIAGNOSIS — I7771 Dissection of carotid artery: Secondary | ICD-10-CM | POA: Insufficient documentation

## 2013-11-23 LAB — POCT INR: INR: 2

## 2013-11-23 NOTE — Patient Instructions (Signed)
Pt instructed to call psychiatrist ASAP to discuss medication adjustments causing low INR levels with mood changes We will call pt tomorrow to check on status

## 2013-11-30 ENCOUNTER — Ambulatory Visit: Payer: Self-pay | Attending: Internal Medicine | Admitting: Pharmacist

## 2013-11-30 ENCOUNTER — Other Ambulatory Visit: Payer: Self-pay

## 2013-11-30 DIAGNOSIS — Z7901 Long term (current) use of anticoagulants: Secondary | ICD-10-CM | POA: Insufficient documentation

## 2013-11-30 DIAGNOSIS — I7771 Dissection of carotid artery: Secondary | ICD-10-CM | POA: Insufficient documentation

## 2013-11-30 DIAGNOSIS — Z5181 Encounter for therapeutic drug level monitoring: Secondary | ICD-10-CM | POA: Insufficient documentation

## 2013-11-30 LAB — POCT INR: INR: 1.7

## 2013-11-30 MED ORDER — METFORMIN HCL 1000 MG PO TABS: 1000.0000 mg | ORAL_TABLET | Freq: Two times a day (BID) | ORAL | Status: DC

## 2013-11-30 NOTE — Progress Notes (Signed)
No changes made in dosing today. Pt instructed to continue with regimen and return next week for repeat

## 2013-12-04 ENCOUNTER — Ambulatory Visit: Payer: Self-pay | Attending: Internal Medicine | Admitting: Internal Medicine

## 2013-12-04 ENCOUNTER — Encounter: Payer: Self-pay | Admitting: Internal Medicine

## 2013-12-04 VITALS — BP 149/92 | HR 74 | Temp 97.4°F | Resp 14 | Ht 69.0 in | Wt 364.0 lb

## 2013-12-04 DIAGNOSIS — I10 Essential (primary) hypertension: Secondary | ICD-10-CM | POA: Insufficient documentation

## 2013-12-04 DIAGNOSIS — Z7901 Long term (current) use of anticoagulants: Secondary | ICD-10-CM | POA: Insufficient documentation

## 2013-12-04 DIAGNOSIS — J449 Chronic obstructive pulmonary disease, unspecified: Secondary | ICD-10-CM | POA: Insufficient documentation

## 2013-12-04 DIAGNOSIS — Z794 Long term (current) use of insulin: Secondary | ICD-10-CM | POA: Insufficient documentation

## 2013-12-04 DIAGNOSIS — Z23 Encounter for immunization: Secondary | ICD-10-CM | POA: Insufficient documentation

## 2013-12-04 DIAGNOSIS — I6529 Occlusion and stenosis of unspecified carotid artery: Secondary | ICD-10-CM | POA: Insufficient documentation

## 2013-12-04 DIAGNOSIS — E119 Type 2 diabetes mellitus without complications: Secondary | ICD-10-CM | POA: Insufficient documentation

## 2013-12-04 DIAGNOSIS — F17210 Nicotine dependence, cigarettes, uncomplicated: Secondary | ICD-10-CM | POA: Insufficient documentation

## 2013-12-04 DIAGNOSIS — I7771 Dissection of carotid artery: Secondary | ICD-10-CM | POA: Insufficient documentation

## 2013-12-04 DIAGNOSIS — Z79899 Other long term (current) drug therapy: Secondary | ICD-10-CM | POA: Insufficient documentation

## 2013-12-04 LAB — POCT GLYCOSYLATED HEMOGLOBIN (HGB A1C): HEMOGLOBIN A1C: 7.5

## 2013-12-04 LAB — GLUCOSE, POCT (MANUAL RESULT ENTRY): POC Glucose: 122 mg/dl — AB (ref 70–99)

## 2013-12-04 NOTE — Progress Notes (Signed)
Patient ID: Jason Hall, male   DOB: September 25, 1960, 53 y.o.   MRN: [id number]  CC: follow up  HPI:  Patient reports that he has been out of the Metformin and Glipizide.  He states that the label on two of his medications are faded and he is unable to tell the difference.  He states that he has been out of his diurectic and knows he has picked up some pounds of fluid.  He reports that he has been making up by taking levemir three times per day and is unable to tell me how much he gives himself.  He reports that he believes he gives himself 40 units of 70/30. He does not currently check his blood sugars at home. He reports that he knows what he is doing.  He has only been taking one BP pill because he has been out of the others.  He knows he is taking his coumadin.   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 Shellfish Allergy Nausea And Vomiting   Past Medical History  Diagnosis Date  . COPD (chronic obstructive pulmonary disease)   . Diabetes mellitus without complication   . Hypertension   . Carotid artery occlusion     Left Carotid Artery Dissectsion   Current Outpatient Prescriptions on File Prior to Visit  Medication Sig Dispense Refill  . acetaminophen (TYLENOL) 325 MG tablet Take 2 tablets (650 mg total) by mouth every 6 (six) hours as needed for moderate pain.      Marland Kitchen amLODipine (NORVASC) 10 MG tablet Take 10 mg by mouth daily.      . busPIRone (BUSPAR) 10 MG tablet Take 20 mg by mouth 3 (three) times daily.      . candesartan (ATACAND) 32 MG tablet Take 32 mg by mouth daily.      Marland Kitchen glipiZIDE (GLUCOTROL) 10 MG tablet Take 10 mg by mouth 2 (two) times daily.      Marland Kitchen glucose blood test strip Use as instructed  100 each  12  . glucose monitoring kit (FREESTYLE) monitoring kit 1 each by Does not apply route as needed for other.  1 each  0  . hydrochlorothiazide (HYDRODIURIL)  25 MG tablet Take 25 mg by mouth daily.      . insulin detemir (LEVEMIR) 100 UNIT/ML injection Inject 0.5 mLs (50 Units total) into the skin 2 (two) times daily.  10 mL  3  . insulin NPH-regular Human (NOVOLIN 70/30) (70-30) 100 UNIT/ML injection Inject 40 Units into the skin 2 (two) times daily with a meal.      . Lancets (FREESTYLE) lancets Use as instructed  100 each  12  . levothyroxine (SYNTHROID, LEVOTHROID) 50 MCG tablet Take 50 mcg by mouth daily before breakfast.      . lisinopril (PRINIVIL,ZESTRIL) 40 MG tablet Take 40 mg by mouth daily.      . metFORMIN (GLUCOPHAGE) 1000 MG tablet Take 1 tablet (1,000 mg total) by mouth 2 (two) times daily with a meal.  60 tablet  1  . sertraline (ZOLOFT) 100 MG tablet Take 100 mg by mouth 2 (two) times daily.      . traZODone (DESYREL) 100 MG tablet Take 300 mg by mouth at bedtime.      . fenofibrate 54 MG tablet Take 54 mg by mouth daily.      . traMADol (ULTRAM) 50 MG tablet  Take 1 tablet (50 mg total) by mouth every 6 (six) hours as needed (88m for mild pain, 768mfor moderate pain, 1005mor severe pain).  30 tablet  0  . warfarin (COUMADIN) 5 MG tablet Take 1.5 tablets (7.5 mg total) by mouth daily.  45 tablet  0   No current facility-administered medications on file prior to visit.   History reviewed. No pertinent family history. History   Social History  . Marital Status: Unknown    Spouse Name: N/A    Number of Children: N/A  . Years of Education: N/A   Occupational History  . Not on file.   Social History Main Topics  . Smoking status: Light Tobacco Smoker    Types: Cigarettes  . Smokeless tobacco: Never Used  . Alcohol Use: No  . Drug Use: No  . Sexual Activity: Not on file   Other Topics Concern  . Not on file   Social History Narrative  . No narrative on file    Review of Systems: See hPI  Objective:   Filed Vitals:   12/04/13 1155  BP: 149/92  Pulse: 74  Temp: 97.4 F (36.3 C)  Resp: 14    Physical  Exam  Constitutional: He is oriented to person, place, and time.  Cardiovascular: Normal rate, regular rhythm and normal heart sounds.   Pulmonary/Chest: Effort normal.  Musculoskeletal: Normal range of motion. He exhibits no edema.  Neurological: He is alert and oriented to person, place, and time.  Skin: Skin is warm and dry.  ].  Lab Results  Component Value Date   WBC 10.0 09/28/2013   HGB 13.8 09/28/2013   HCT 43.7 09/28/2013   MCV 91.6 09/28/2013   PLT 286 09/28/2013   Lab Results  Component Value Date   CREATININE 0.56 09/22/2013   BUN 17 09/22/2013   NA 143 09/22/2013   K 3.6* 09/22/2013   CL 102 09/22/2013   CO2 26 09/22/2013    Lab Results  Component Value Date   HGBA1C 7.5 12/04/2013   Lipid Panel  No results found for this basename: chol, trig, hdl, cholhdl, vldl, ldlcalc       Assessment and plan:   GreLaterrances seen today for follow-up.  Diagnoses and associated orders for this visit:  Type 2 diabetes mellitus without complication - Glucose (CBG) - HgB A1c - Ambulatory referral to Podiatry When he returns Monday for his INR he will need the RN to look at every medication he is taking.  Essential hypertension  Explained to patient diet modifications that may contribute to elevated BP. Patient will avoid foods that are high in sodium such as  canned soups and vegetables, tomato juice, commercial baked goods, commercially prepared frozen or canned entrees and sauces. Avoid salty snacks, added salt when cooking, and substituting for low sodium herbs or spices Explained exercise regimen of cardio at least three times weekly to help lower BP and cholesterol Will review medications Monday  Need for prophylactic vaccination and inoculation against influenza Influenza injection received.  Explained side effects and contraindications to patient. Information sheet given to patient.  Carotid artery dissection Continue Coumadin until discontinued by vascular surgery (next  follow up in 4 months with Vascular)  Return in about 1 week (around 12/11/2013) for Nurse Visit-Med evaluation .       KECChari ManningP-C [facility] [phone]/04/2013, 12:18 PM

## 2013-12-04 NOTE — Progress Notes (Signed)
Pt is here following up on his diabetes and HTN. 

## 2013-12-04 NOTE — Patient Instructions (Signed)
Levemir-long acting insulin around 9 pm, give 50 units  Novolin 70/30- give 20 units twice per day.  This one he can take before breakfast and the second dose around 3 or 4 pm.    He needs to check his blood sugar before every meal and before bedtime= 4 times per day  He will need to bring all medications Monday when he comes for blood work so we can get a understanding of his medications     Write down each pill he takes this weekend and what time.

## 2013-12-07 ENCOUNTER — Ambulatory Visit: Payer: Self-pay | Attending: Internal Medicine | Admitting: Pharmacist

## 2013-12-07 DIAGNOSIS — I7771 Dissection of carotid artery: Secondary | ICD-10-CM

## 2013-12-07 LAB — POCT INR: INR: 1.6

## 2013-12-11 ENCOUNTER — Ambulatory Visit: Payer: Self-pay | Attending: Internal Medicine | Admitting: Pharmacist

## 2013-12-11 DIAGNOSIS — Z7901 Long term (current) use of anticoagulants: Secondary | ICD-10-CM | POA: Insufficient documentation

## 2013-12-11 DIAGNOSIS — I7771 Dissection of carotid artery: Secondary | ICD-10-CM | POA: Insufficient documentation

## 2013-12-11 LAB — GLUCOSE, POCT (MANUAL RESULT ENTRY): POC Glucose: 196 mg/dl — AB (ref 70–99)

## 2013-12-11 LAB — POCT INR: INR: 1.5

## 2013-12-11 NOTE — Progress Notes (Signed)
Patient ID: Jason Hall, male   DOB: 10/12/1960, 53 y.o.   MRN: [id number] Pt comes in for INR with teaching today Unsure if pt is compliant with taking Coumadin due to cognition  Pt prescribed 5 mg tab with titrated doses due to Sub-therapeutic per last 2 visits Pt has brought in all medications for evaluation

## 2013-12-14 ENCOUNTER — Encounter: Payer: Self-pay | Admitting: Pharmacist

## 2013-12-15 ENCOUNTER — Other Ambulatory Visit: Payer: Self-pay | Admitting: Emergency Medicine

## 2013-12-15 MED ORDER — METFORMIN HCL 1000 MG PO TABS: 1000.0000 mg | ORAL_TABLET | Freq: Two times a day (BID) | ORAL | Status: DC

## 2013-12-18 ENCOUNTER — Other Ambulatory Visit: Payer: Self-pay | Admitting: Pharmacist

## 2013-12-18 ENCOUNTER — Ambulatory Visit: Payer: Self-pay | Attending: Internal Medicine | Admitting: Pharmacist

## 2013-12-18 DIAGNOSIS — I7771 Dissection of carotid artery: Secondary | ICD-10-CM

## 2013-12-18 DIAGNOSIS — I8290 Acute embolism and thrombosis of unspecified vein: Secondary | ICD-10-CM

## 2013-12-18 LAB — POCT INR: INR: 1.8

## 2013-12-18 MED ORDER — WARFARIN SODIUM 5 MG PO TABS: ORAL_TABLET | ORAL | Status: DC

## 2013-12-25 ENCOUNTER — Ambulatory Visit: Payer: Self-pay | Attending: Internal Medicine | Admitting: Pharmacist

## 2013-12-25 DIAGNOSIS — I7771 Dissection of carotid artery: Secondary | ICD-10-CM

## 2013-12-25 LAB — POCT INR: INR: 3.1

## 2014-01-06 ENCOUNTER — Other Ambulatory Visit: Payer: Self-pay | Admitting: Emergency Medicine

## 2014-01-06 MED ORDER — OLMESARTAN MEDOXOMIL 40 MG PO TABS: 40.0000 mg | ORAL_TABLET | Freq: Every day | ORAL | Status: DC

## 2014-01-08 ENCOUNTER — Ambulatory Visit: Payer: Self-pay | Attending: Internal Medicine | Admitting: Pharmacist

## 2014-01-08 DIAGNOSIS — I8290 Acute embolism and thrombosis of unspecified vein: Secondary | ICD-10-CM

## 2014-01-08 LAB — POCT INR: INR: 2.5

## 2014-01-15 ENCOUNTER — Ambulatory Visit: Payer: Self-pay | Attending: Internal Medicine | Admitting: Pharmacist

## 2014-01-15 DIAGNOSIS — I7771 Dissection of carotid artery: Secondary | ICD-10-CM

## 2014-01-15 LAB — POCT INR: INR: 1

## 2014-01-15 MED ORDER — GLIPIZIDE 10 MG PO TABS: 10.0000 mg | ORAL_TABLET | Freq: Two times a day (BID) | ORAL | Status: DC

## 2014-01-22 ENCOUNTER — Other Ambulatory Visit: Payer: Self-pay | Admitting: *Deleted

## 2014-01-22 ENCOUNTER — Ambulatory Visit: Payer: Self-pay | Attending: Internal Medicine | Admitting: Pharmacist

## 2014-01-22 ENCOUNTER — Other Ambulatory Visit: Payer: Self-pay | Admitting: Emergency Medicine

## 2014-01-22 ENCOUNTER — Encounter: Payer: Self-pay | Admitting: Pharmacist

## 2014-01-22 DIAGNOSIS — I7771 Dissection of carotid artery: Secondary | ICD-10-CM

## 2014-01-22 DIAGNOSIS — E119 Type 2 diabetes mellitus without complications: Secondary | ICD-10-CM

## 2014-01-22 LAB — POCT INR: INR: 1.4

## 2014-01-22 MED ORDER — INSULIN NPH ISOPHANE & REGULAR (70-30) 100 UNIT/ML ~~LOC~~ SUSP: 40.0000 [IU] | Freq: Two times a day (BID) | SUBCUTANEOUS | Status: DC

## 2014-01-22 MED ORDER — INSULIN DETEMIR 100 UNIT/ML ~~LOC~~ SOLN: 50.0000 [IU] | Freq: Two times a day (BID) | SUBCUTANEOUS | Status: DC

## 2014-01-22 NOTE — Progress Notes (Signed)
Pharmacy requested to refill pt's insulin. I sent the rx to the pharmacy

## 2014-01-25 ENCOUNTER — Other Ambulatory Visit: Payer: Self-pay | Admitting: Internal Medicine

## 2014-01-25 MED ORDER — INSULIN NPH ISOPHANE & REGULAR (70-30) 100 UNIT/ML ~~LOC~~ SUSP: 40.0000 [IU] | Freq: Two times a day (BID) | SUBCUTANEOUS | Status: DC

## 2014-02-01 ENCOUNTER — Ambulatory Visit: Payer: No Typology Code available for payment source | Attending: Internal Medicine | Admitting: Pharmacist

## 2014-02-01 DIAGNOSIS — I8290 Acute embolism and thrombosis of unspecified vein: Secondary | ICD-10-CM

## 2014-02-01 DIAGNOSIS — I7771 Dissection of carotid artery: Secondary | ICD-10-CM

## 2014-02-01 LAB — POCT INR: INR: 1.7

## 2014-02-01 MED ORDER — WARFARIN SODIUM 5 MG PO TABS: ORAL_TABLET | ORAL | Status: DC

## 2014-02-08 ENCOUNTER — Ambulatory Visit: Payer: Self-pay | Attending: Internal Medicine | Admitting: Pharmacist

## 2014-02-08 DIAGNOSIS — E119 Type 2 diabetes mellitus without complications: Secondary | ICD-10-CM

## 2014-02-08 DIAGNOSIS — I7771 Dissection of carotid artery: Secondary | ICD-10-CM

## 2014-02-08 LAB — LIPID PANEL
CHOLESTEROL: 229 mg/dL — AB (ref 0–200)
HDL: 40 mg/dL (ref >39)
Total CHOL/HDL Ratio: 5.7 Ratio
Triglycerides: 463 mg/dL — ABNORMAL HIGH (ref <150)

## 2014-02-08 LAB — POCT INR: INR: 2.9

## 2014-02-08 MED ORDER — METFORMIN HCL 1000 MG PO TABS: 1000.0000 mg | ORAL_TABLET | Freq: Two times a day (BID) | ORAL | Status: DC

## 2014-02-08 MED ORDER — AMLODIPINE BESYLATE 10 MG PO TABS: 10.0000 mg | ORAL_TABLET | Freq: Every day | ORAL | Status: DC

## 2014-02-08 MED ORDER — LISINOPRIL 40 MG PO TABS: 40.0000 mg | ORAL_TABLET | Freq: Every day | ORAL | Status: DC

## 2014-02-15 ENCOUNTER — Other Ambulatory Visit: Payer: Self-pay | Admitting: Internal Medicine

## 2014-02-15 ENCOUNTER — Ambulatory Visit: Payer: No Typology Code available for payment source | Attending: Internal Medicine | Admitting: Pharmacist

## 2014-02-15 DIAGNOSIS — I82409 Acute embolism and thrombosis of unspecified deep veins of unspecified lower extremity: Secondary | ICD-10-CM

## 2014-02-15 LAB — POCT INR: INR: 3.3

## 2014-02-15 NOTE — Patient Instructions (Signed)
Take 5mg  coumadin tomorrow in the am and than resume with7.5mg  of coumadin Daily.  Return in one week for repeat INR

## 2014-02-22 ENCOUNTER — Ambulatory Visit: Payer: Self-pay | Attending: Internal Medicine | Admitting: Pharmacist

## 2014-02-22 DIAGNOSIS — I7771 Dissection of carotid artery: Secondary | ICD-10-CM

## 2014-02-22 LAB — POCT INR: INR: 3.1

## 2014-02-22 MED ORDER — LEVOTHYROXINE SODIUM 50 MCG PO TABS: 50.0000 ug | ORAL_TABLET | Freq: Every day | ORAL | Status: DC

## 2014-02-22 MED ORDER — FENOFIBRATE 54 MG PO TABS: 54.0000 mg | ORAL_TABLET | Freq: Every day | ORAL | Status: DC

## 2014-02-22 MED ORDER — HYDROCHLOROTHIAZIDE 25 MG PO TABS: 25.0000 mg | ORAL_TABLET | Freq: Every day | ORAL | Status: DC

## 2014-02-24 ENCOUNTER — Telehealth: Payer: Self-pay | Admitting: *Deleted

## 2014-02-24 MED ORDER — ATORVASTATIN CALCIUM 20 MG PO TABS: 20.0000 mg | ORAL_TABLET | Freq: Every day | ORAL | Status: DC

## 2014-02-24 NOTE — Telephone Encounter (Signed)
Pt aware of lab results Rx [facility]

## 2014-02-24 NOTE — Telephone Encounter (Signed)
-----   Message from Ambrose FinlandValerie A Keck, NP sent at 02/23/2014  2:00 PM EST ----- If no contraindications please sent atorvastatin 20 mg to take daily and educate on lifestyle changes

## 2014-03-01 ENCOUNTER — Ambulatory Visit: Payer: Self-pay | Attending: Internal Medicine | Admitting: Pharmacist

## 2014-03-01 DIAGNOSIS — I7771 Dissection of carotid artery: Secondary | ICD-10-CM

## 2014-03-01 LAB — POCT INR: INR: 1.8

## 2014-03-08 ENCOUNTER — Ambulatory Visit: Payer: Self-pay | Attending: Internal Medicine | Admitting: Pharmacist

## 2014-03-08 DIAGNOSIS — I7771 Dissection of carotid artery: Secondary | ICD-10-CM

## 2014-03-08 DIAGNOSIS — I8290 Acute embolism and thrombosis of unspecified vein: Secondary | ICD-10-CM

## 2014-03-08 LAB — POCT INR: INR: 1.9

## 2014-03-15 ENCOUNTER — Ambulatory Visit: Payer: Self-pay | Attending: Internal Medicine | Admitting: Pharmacist

## 2014-03-15 ENCOUNTER — Other Ambulatory Visit: Payer: Self-pay | Admitting: *Deleted

## 2014-03-15 DIAGNOSIS — I7771 Dissection of carotid artery: Secondary | ICD-10-CM | POA: Insufficient documentation

## 2014-03-15 LAB — POCT INR: INR: 2.6

## 2014-03-15 MED ORDER — LISINOPRIL 40 MG PO TABS: 40.0000 mg | ORAL_TABLET | Freq: Every day | ORAL | Status: DC

## 2014-03-15 MED ORDER — "INSULIN SYRINGE 28G X 1/2"" 1 ML MISC": Status: DC

## 2014-03-22 ENCOUNTER — Ambulatory Visit: Payer: Self-pay | Attending: Internal Medicine | Admitting: Pharmacist

## 2014-03-22 ENCOUNTER — Other Ambulatory Visit: Payer: Self-pay | Admitting: Emergency Medicine

## 2014-03-22 ENCOUNTER — Telehealth: Payer: Self-pay | Admitting: Internal Medicine

## 2014-03-22 DIAGNOSIS — I7771 Dissection of carotid artery: Secondary | ICD-10-CM

## 2014-03-22 LAB — POCT INR: INR: 2.1

## 2014-03-22 MED ORDER — WARFARIN SODIUM 5 MG PO TABS: ORAL_TABLET | ORAL | Status: DC

## 2014-03-22 NOTE — Telephone Encounter (Signed)
Pt is needing a refill for his coumadin. He does not have enough to last him till his next appt. Please follow up with pt.

## 2014-04-12 ENCOUNTER — Ambulatory Visit: Payer: Self-pay | Attending: Internal Medicine | Admitting: Pharmacist

## 2014-04-12 DIAGNOSIS — I7771 Dissection of carotid artery: Secondary | ICD-10-CM

## 2014-04-12 LAB — POCT INR: INR: 1.2

## 2014-04-14 ENCOUNTER — Encounter: Payer: Self-pay | Admitting: Vascular Surgery

## 2014-04-15 ENCOUNTER — Other Ambulatory Visit: Payer: Self-pay | Admitting: *Deleted

## 2014-04-15 DIAGNOSIS — Z01812 Encounter for preprocedural laboratory examination: Secondary | ICD-10-CM

## 2014-04-16 ENCOUNTER — Ambulatory Visit: Payer: Self-pay | Admitting: Vascular Surgery

## 2014-04-16 ENCOUNTER — Inpatient Hospital Stay: Admission: RE | Admit: 2014-04-16 | Payer: Self-pay | Source: Ambulatory Visit

## 2014-04-20 ENCOUNTER — Ambulatory Visit: Payer: Self-pay | Attending: Internal Medicine | Admitting: Pharmacist

## 2014-04-20 DIAGNOSIS — I7771 Dissection of carotid artery: Secondary | ICD-10-CM

## 2014-04-20 LAB — POCT INR: INR: 2.1

## 2014-04-27 ENCOUNTER — Telehealth: Payer: Self-pay | Admitting: Emergency Medicine

## 2014-04-27 ENCOUNTER — Encounter: Payer: Self-pay | Admitting: Pharmacist

## 2014-04-29 ENCOUNTER — Ambulatory Visit: Payer: Self-pay | Attending: Internal Medicine | Admitting: Pharmacist

## 2014-04-29 DIAGNOSIS — I8290 Acute embolism and thrombosis of unspecified vein: Secondary | ICD-10-CM

## 2014-04-29 LAB — POCT INR: INR: 1.8

## 2014-05-05 ENCOUNTER — Ambulatory Visit: Payer: Self-pay | Attending: Internal Medicine | Admitting: Pharmacist

## 2014-05-05 DIAGNOSIS — I8290 Acute embolism and thrombosis of unspecified vein: Secondary | ICD-10-CM

## 2014-05-05 LAB — POCT INR: INR: 2.6

## 2014-05-06 ENCOUNTER — Encounter: Payer: Self-pay | Admitting: Pharmacist

## 2014-05-06 ENCOUNTER — Encounter: Payer: Self-pay | Admitting: Vascular Surgery

## 2014-05-07 ENCOUNTER — Ambulatory Visit (HOSPITAL_COMMUNITY)
Admission: RE | Admit: 2014-05-07 | Discharge: 2014-05-07 | Disposition: A | Discharge status: Home / Self Care | Payer: No Typology Code available for payment source | Source: Ambulatory Visit | Attending: Vascular Surgery | Admitting: Vascular Surgery

## 2014-05-07 ENCOUNTER — Encounter (HOSPITAL_COMMUNITY): Payer: Self-pay

## 2014-05-07 ENCOUNTER — Ambulatory Visit: Payer: Self-pay | Admitting: Vascular Surgery

## 2014-05-07 DIAGNOSIS — I6523 Occlusion and stenosis of bilateral carotid arteries: Secondary | ICD-10-CM | POA: Insufficient documentation

## 2014-05-07 DIAGNOSIS — M479 Spondylosis, unspecified: Secondary | ICD-10-CM | POA: Insufficient documentation

## 2014-05-07 DIAGNOSIS — I7771 Dissection of carotid artery: Secondary | ICD-10-CM

## 2014-05-07 LAB — POCT I-STAT CREATININE: Creatinine, Ser: 0.6 mg/dL (ref 0.50–1.35)

## 2014-05-07 MED ORDER — IOHEXOL 350 MG/ML SOLN
80.0000 mL | Freq: Once | INTRAVENOUS | Status: AC | PRN
  Administered 2014-05-07: 80 mL via INTRAVENOUS

## 2014-05-17 ENCOUNTER — Ambulatory Visit: Payer: Self-pay | Attending: Internal Medicine | Admitting: Internal Medicine

## 2014-05-17 ENCOUNTER — Encounter: Payer: Self-pay | Admitting: Internal Medicine

## 2014-05-17 VITALS — BP 130/83 | HR 99 | Temp 98.5°F | Resp 16 | Ht 69.0 in | Wt 355.0 lb

## 2014-05-17 DIAGNOSIS — I10 Essential (primary) hypertension: Secondary | ICD-10-CM

## 2014-05-17 DIAGNOSIS — E119 Type 2 diabetes mellitus without complications: Secondary | ICD-10-CM

## 2014-05-17 DIAGNOSIS — Z87891 Personal history of nicotine dependence: Secondary | ICD-10-CM | POA: Insufficient documentation

## 2014-05-17 DIAGNOSIS — E785 Hyperlipidemia, unspecified: Secondary | ICD-10-CM

## 2014-05-17 DIAGNOSIS — E039 Hypothyroidism, unspecified: Secondary | ICD-10-CM

## 2014-05-17 DIAGNOSIS — I7771 Dissection of carotid artery: Secondary | ICD-10-CM

## 2014-05-17 LAB — GLUCOSE, POCT (MANUAL RESULT ENTRY): POC Glucose: 333 mg/dl — AB (ref 70–99)

## 2014-05-17 LAB — POCT GLYCOSYLATED HEMOGLOBIN (HGB A1C): Hemoglobin A1C: 11.4

## 2014-05-17 MED ORDER — INSULIN NPH ISOPHANE & REGULAR (70-30) 100 UNIT/ML ~~LOC~~ SUSP: 40.0000 [IU] | Freq: Two times a day (BID) | SUBCUTANEOUS | Status: DC

## 2014-05-17 MED ORDER — METFORMIN HCL 1000 MG PO TABS: 1000.0000 mg | ORAL_TABLET | Freq: Two times a day (BID) | ORAL | Status: DC

## 2014-05-17 MED ORDER — ATORVASTATIN CALCIUM 20 MG PO TABS: 20.0000 mg | ORAL_TABLET | Freq: Every day | ORAL | Status: DC

## 2014-05-17 MED ORDER — INSULIN PEN NEEDLE 31G X 6 MM MISC: Status: DC

## 2014-05-17 MED ORDER — INSULIN DETEMIR 100 UNIT/ML ~~LOC~~ SOLN: 50.0000 [IU] | Freq: Two times a day (BID) | SUBCUTANEOUS | Status: DC

## 2014-05-17 NOTE — Patient Instructions (Signed)
Please call to schedule your appointment with [facility] [phone]. The referral has already been placed.   Diabetes and Standards of Medical Care Diabetes is complicated. You may find that your diabetes team includes a dietitian, nurse, diabetes educator, eye doctor, and more. To help everyone know what is going on and to help you get the care you deserve, the following schedule of care was developed to help keep you on track. Below are the tests, exams, vaccines, medicines, education, and plans you will need. HbA1c test This test shows how well you have controlled your glucose over the past 2-3 months. It is used to see if your diabetes management plan needs to be adjusted.   It is performed at least 2 times a year if you are meeting treatment goals.  It is performed 4 times a year if therapy has changed or if you are not meeting treatment goals. Blood pressure test  This test is performed at every routine medical visit. The goal is less than 140/90 mm Hg for most people, but 130/80 mm Hg in some cases. Ask your health care provider about your goal. Dental exam  Follow up with the dentist regularly. Eye exam  If you are diagnosed with type 1 diabetes as a child, get an exam upon reaching the age of 65 years or older and have had diabetes for 3-5 years. Yearly eye exams are recommended after that initial eye exam.  If you are diagnosed with type 1 diabetes as an adult, get an exam within 5 years of diagnosis and then yearly.  If you are diagnosed with type 2 diabetes, get an exam as soon as possible after the diagnosis and then yearly. Foot care exam  Visual foot exams are performed at every routine medical visit. The exams check for cuts, injuries, or other problems with the feet.  A comprehensive foot exam should be done yearly. This includes visual inspection as well as assessing foot pulses and testing for loss of sensation.  Check your feet nightly for cuts, injuries, or  other problems with your feet. Tell your health care provider if anything is not healing. Kidney function test (urine microalbumin)  This test is performed once a year.  Type 1 diabetes: The first test is performed 5 years after diagnosis.  Type 2 diabetes: The first test is performed at the time of diagnosis.  A serum creatinine and estimated glomerular filtration rate (eGFR) test is done once a year to assess the level of chronic kidney disease (CKD), if present. Lipid profile (cholesterol, HDL, LDL, triglycerides)  Performed every 5 years for most people.  The goal for LDL is less than 100 mg/dL. If you are at high risk, the goal is less than 70 mg/dL.  The goal for HDL is 40 mg/dL-50 mg/dL for men and 50 mg/dL-60 mg/dL for women. An HDL cholesterol of 60 mg/dL or higher gives some protection against heart disease.  The goal for triglycerides is less than 150 mg/dL. Influenza vaccine, pneumococcal vaccine, and hepatitis B vaccine  The influenza vaccine is recommended yearly.  It is recommended that people with diabetes who are over 76 years old get the pneumonia vaccine. In some cases, two separate shots may be given. Ask your health care provider if your pneumonia vaccination is up to date.  The hepatitis B vaccine is also recommended for adults with diabetes. Diabetes self-management education  Education is recommended at diagnosis and ongoing as needed. Treatment plan  Your treatment plan is  reviewed at every medical visit. Document Released: 12/17/2008 Document Revised: 07/06/2013 Document Reviewed: 07/22/2012 [facility] Patient Information 2015 [place], [place]. This information is not intended to replace advice given to you by your health care provider. Make sure you discuss any questions you have with your health care provider.

## 2014-05-17 NOTE — Progress Notes (Signed)
Pt is here today following up on his diabetes and HTN. Pt reports having polyuria.

## 2014-05-17 NOTE — Progress Notes (Addendum)
Patient ID: Jason Hall, male   DOB: 10/04/1960, 54 y.o.   MRN: [id number] 1. HTN: Medication: Norvasc, HCTZ, lisinopril Home BP monitoring: does not monitor at home  Positive ROS: none Negative NWG:NFAOZHYQM, chest pain, palpitations, blurred vision   2. DM2:  Medication: Reports that he has been taking his Levemir twice daily but occasionally taking his 70/30 because he did not want to run out of the medication. He was out of his Metformin for months and recently started back on the medication.  Home CBG monitoring: has not been checking sugars because he did not feel like it Hypoglycemic event: none  Positive ROS polyuria/dipsia Negative VHQ:IONGEXB vision, headaches, neuropathy  3. HLD: Medication: Lipitor Tolerance: well Positive ROS:  Negative ROS: claudication, myalgia   Social History reviewed: Smoker: former  Exercise not currently    Physical Exam  Constitutional: He is oriented to person, place, and time.  Neck: Normal range of motion.  Cardiovascular: Normal rate, regular rhythm and normal heart sounds.   Pulmonary/Chest: Effort normal and breath sounds normal.  Musculoskeletal: Normal range of motion.  Neurological: He is alert and oriented to person, place, and time.  Skin: Skin is warm and dry.     Jason Hall was seen today for follow-up.  Diagnoses and all orders for this visit:  Type 2 diabetes mellitus without complication Orders: -     Glucose (CBG) -     HgB A1c -     metFORMIN (GLUCOPHAGE) 1000 MG tablet; Take 1 tablet (1,000 mg total) by mouth 2 (two) times daily with a meal. -     Discontinue: insulin NPH-regular Human (NOVOLIN 70/30) (70-30) 100 UNIT/ML injection; Inject 40 Units into the skin 2 (two) times daily with a meal. -     insulin detemir (LEVEMIR) 100 UNIT/ML injection; Inject 0.5 mLs (50 Units total) into the skin 2 (two) times daily. -     Microalbumin, urine -     Insulin Pen Needle (1ST CHOICE PEN NEEDLES) 31G X 6 MM MISC; Take  twice daily. Patient has been non-compliant with medication regimen and has gone from a A1C of 7.5 to 11.4. Stressed the multiple complications associated with uncontrolled diabetes.  Patient will stay on current Levemir dose  and report back to clinic with cbg log in 2 weeks. Patient will not take 70/30 any longer.  Essential hypertension Patient blood pressure is stable and may continue on current medication.  Education on diet, exercise, and modifiable risk factors discussed. Will obtain appropriate labs as needed. Will follow up in 3-6 months.   HLD (hyperlipidemia) Orders: -     atorvastatin (LIPITOR) 20 MG tablet; Take 1 tablet (20 mg total) by mouth daily. -     Lipid Panel Education provided on proper lifestyle changes in order to lower cholesterol. Patient advised to maintain healthy weight and to keep total fat intake at 25-35% of total calories and carbohydrates 50-60% of total daily calories. Explained how high cholesterol places patient at risk for heart disease. 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 He reports that he has a follow up with Vascular surgery on 3/24 and they will decide if he may come off coumadin.  Hypothyroidism, unspecified hypothyroidism type Orders: -     TSH -     T4, Free Will send updates Rx of Synthroid when levels return  Return in about 2 weeks (around 05/31/2014) for  Nurse Visit--cbg log and 3 mo PCP.  Holland CommonsKECK, VALERIE, NP 05/17/2014 4:24 PM

## 2014-05-18 LAB — LIPID PANEL
Cholesterol: 264 mg/dL — ABNORMAL HIGH (ref 0–200)
HDL: 33 mg/dL — AB (ref >=40)
Total CHOL/HDL Ratio: 8 Ratio
Triglycerides: 558 mg/dL — ABNORMAL HIGH (ref <150)

## 2014-05-18 LAB — MICROALBUMIN, URINE: Microalb, Ur: 10.4 mg/dL — ABNORMAL HIGH (ref <2.0)

## 2014-05-18 LAB — TSH: TSH: 5.725 u[IU]/mL — AB (ref 0.350–4.500)

## 2014-05-18 LAB — T4, FREE: Free T4: 0.93 ng/dL (ref 0.80–1.80)

## 2014-05-20 ENCOUNTER — Telehealth: Payer: Self-pay | Admitting: *Deleted

## 2014-05-20 DIAGNOSIS — E785 Hyperlipidemia, unspecified: Secondary | ICD-10-CM

## 2014-05-20 MED ORDER — LEVOTHYROXINE SODIUM 75 MCG PO TABS: 75.0000 ug | ORAL_TABLET | Freq: Every day | ORAL | Status: DC

## 2014-05-20 MED ORDER — ATORVASTATIN CALCIUM 40 MG PO TABS: 40.0000 mg | ORAL_TABLET | Freq: Every day | ORAL | Status: DC

## 2014-05-20 NOTE — Telephone Encounter (Signed)
Pt is aware of his lab results and the medication changes.

## 2014-05-20 NOTE — Telephone Encounter (Signed)
-----   Message from Ambrose FinlandValerie A Keck, NP sent at 05/18/2014 10:34 AM EDT ----- Please new Rx of Synthroid 75 mcg for patient to take daily at least 30-60 minutes before breakfast and other medications. Will recheck TSH is 2 months. Please place future order. Please make sure patient is taking his atorvastatin daily because his levels have increased a ton. If he say he is please increase to 40 mg daily if no contraindications. Explain to patient that if he does not get his cholesterol and diabetes controlled then he could develop pancreatitis with such high triglycerides. Please go over diet changes, weight loss, exercise. Thanks

## 2014-05-26 ENCOUNTER — Encounter: Payer: Self-pay | Admitting: Vascular Surgery

## 2014-05-27 ENCOUNTER — Ambulatory Visit: Payer: Self-pay | Admitting: Vascular Surgery

## 2014-06-01 ENCOUNTER — Ambulatory Visit: Payer: Self-pay | Admitting: *Deleted

## 2014-06-01 NOTE — Progress Notes (Signed)
I was asked by Deisy at front desk to see this patient who had shown up to see Lauren RN for CBG log review.  Jason ShamesLauren is currently out of town unexpectedly.  Patient was brought back where he revealed that he did not bring his meter or his sugar log.  I explained that I could not conduct our visit today with out those two things.  I walked patient back to scheduling and had him make another appointment and told him he must bring his log with him

## 2014-06-17 ENCOUNTER — Encounter: Payer: Self-pay | Admitting: Vascular Surgery

## 2014-06-18 ENCOUNTER — Encounter: Payer: Self-pay | Admitting: Vascular Surgery

## 2014-06-18 ENCOUNTER — Ambulatory Visit (INDEPENDENT_AMBULATORY_CARE_PROVIDER_SITE_OTHER): Payer: Self-pay | Admitting: Vascular Surgery

## 2014-06-18 ENCOUNTER — Other Ambulatory Visit: Payer: Self-pay | Admitting: Internal Medicine

## 2014-06-18 VITALS — BP 140/78 | HR 75 | Ht 69.0 in | Wt 360.3 lb

## 2014-06-18 DIAGNOSIS — I7771 Dissection of carotid artery: Secondary | ICD-10-CM
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
## 2014-06-18 NOTE — Addendum Note (Signed)
Addended by: Adria DillELDRIDGE-LEWIS, Norabelle Kondo L on: 06/18/2014 04:29 PM   Modules accepted: Orders

## 2014-06-21 ENCOUNTER — Ambulatory Visit: Payer: Self-pay | Attending: Internal Medicine | Admitting: *Deleted

## 2014-06-21 VITALS — BP 134/92 | HR 89 | Temp 98.3°F | Resp 18

## 2014-06-21 DIAGNOSIS — Z794 Long term (current) use of insulin: Secondary | ICD-10-CM | POA: Insufficient documentation

## 2014-06-21 DIAGNOSIS — E1165 Type 2 diabetes mellitus with hyperglycemia: Secondary | ICD-10-CM | POA: Insufficient documentation

## 2014-06-21 LAB — GLUCOSE, POCT (MANUAL RESULT ENTRY): POC Glucose: 190 mg/dl — AB (ref 70–99)

## 2014-06-21 NOTE — Progress Notes (Signed)
Patient presents for CBG and record review for T2DM Med list reviewed; patient reports taking all meds as directed plus taking Novolin 70/30 isulin 40 units at bedtime daily Patient educated that PCP d/c 70/30 insulin at last visit Patient taking levemir 50 units bid, metformin 1000 mg bid and glipizide 10 mg bid  S/sx of hypoglycemia and immediate actions to take discussed in detail Patient's AM fasting blood sugars ranging 64-177 (6 reading) Patient's before bed blood sugars ranging 185-287 (3 readings) Patient given blood sugar log and instructed on use. Instructed to bring to all future visits. Walking 15 minutes per day for exercise  CBG 190 2.75 hours after meal  BP 134/92 P 89 R 18 T  98.3 oral SpO2 97%  Per PCP: Stop Novoloin 70/30 insulin Referral to endocrinology  Patient instructed to make appt with financial counselor  Encouraged patient to meet with In-house Health Coach for diabetic meal planning  Patient aware that he is to f/u with PCP 3 months from last visit. Due 08/17/2014  Patient given literature on Diabetes and Food, Diabetes and Exercise, Basic Carb Counting, and Hypoglycemia

## 2014-06-21 NOTE — Patient Instructions (Signed)
Diabetes Mellitus and Food It is important for you to manage your blood sugar (glucose) level. Your blood glucose level can be greatly affected by what you eat. Eating healthier foods in the appropriate amounts throughout the day at about the same time each day will help you control your blood glucose level. It can also help slow or prevent worsening of your diabetes mellitus. Healthy eating may even help you improve the level of your blood pressure and reach or maintain a healthy weight.  HOW CAN FOOD AFFECT ME? Carbohydrates Carbohydrates affect your blood glucose level more than any other type of food. Your dietitian will help you determine how many carbohydrates to eat at each meal and teach you how to count carbohydrates. Counting carbohydrates is important to keep your blood glucose at a healthy level, especially if you are using insulin or taking certain medicines for diabetes mellitus. Alcohol Alcohol can cause sudden decreases in blood glucose (hypoglycemia), especially if you use insulin or take certain medicines for diabetes mellitus. Hypoglycemia can be a life-threatening condition. Symptoms of hypoglycemia (sleepiness, dizziness, and disorientation) are similar to symptoms of having too much alcohol.  If your health care provider has given you approval to drink alcohol, do so in moderation and use the following guidelines:  Women should not have more than one drink per day, and men should not have more than two drinks per day. One drink is equal to:  12 oz of beer.  5 oz of wine.  1 oz of hard liquor.  Do not drink on an empty stomach.  Keep yourself hydrated. Have water, diet soda, or unsweetened iced tea.  Regular soda, juice, and other mixers might contain a lot of carbohydrates and should be counted. WHAT FOODS ARE NOT RECOMMENDED? As you make food choices, it is important to remember that all foods are not the same. Some foods have fewer nutrients per serving than other  foods, even though they might have the same number of calories or carbohydrates. It is difficult to get your body what it needs when you eat foods with fewer nutrients. Examples of foods that you should avoid that are high in calories and carbohydrates but low in nutrients include:  Trans fats (most processed foods list trans fats on the Nutrition Facts label).  Regular soda.  Juice.  Candy.  Sweets, such as cake, pie, doughnuts, and cookies.  Fried foods. WHAT FOODS CAN I EAT? Have nutrient-rich foods, which will nourish your body and keep you healthy. The food you should eat also will depend on several factors, including:  The calories you need.  The medicines you take.  Your weight.  Your blood glucose level.  Your blood pressure level.  Your cholesterol level. You also should eat a variety of foods, including:  Protein, such as meat, poultry, fish, tofu, nuts, and seeds (lean animal proteins are best).  Fruits.  Vegetables.  Dairy products, such as milk, cheese, and yogurt (low fat is best).  Breads, grains, pasta, cereal, rice, and beans.  Fats such as olive oil, trans fat-free margarine, canola oil, avocado, and olives. DOES EVERYONE WITH DIABETES MELLITUS HAVE THE SAME MEAL PLAN? Because every person with diabetes mellitus is different, there is not one meal plan that works for everyone. It is very important that you meet with a dietitian who will help you create a meal plan that is just right for you. Document Released: 11/16/2004 Document Revised: 02/24/2013 Document Reviewed: 01/16/2013 ExitCare Patient Information 2015 [employer]. This   information is not intended to replace advice given to you by your health care provider. Make sure you discuss any questions you have with your health care provider. Diabetes and Exercise Exercising regularly is important. It is not just about losing weight. It has many health benefits, such as:  Improving your overall  fitness, flexibility, and endurance.  Increasing your bone density.  Helping with weight control.  Decreasing your body fat.  Increasing your muscle strength.  Reducing stress and tension.  Improving your overall health. People with diabetes who exercise gain additional benefits because exercise:  Reduces appetite.  Improves the body's use of blood sugar (glucose).  Helps lower or control blood glucose.  Decreases blood pressure.  Helps control blood lipids (such as cholesterol and triglycerides).  Improves the body's use of the hormone insulin by:  Increasing the body's insulin sensitivity.  Reducing the body's insulin needs.  Decreases the risk for heart disease because exercising:  Lowers cholesterol and triglycerides levels.  Increases the levels of good cholesterol (such as high-density lipoproteins [HDL]) in the body.  Lowers blood glucose levels. YOUR ACTIVITY PLAN  Choose an activity that you enjoy and set realistic goals. Your health care provider or diabetes educator can help you make an activity plan that works for you. Exercise regularly as directed by your health care provider. This includes:  Performing resistance training twice a week such as push-ups, sit-ups, lifting weights, or using resistance bands.  Performing 150 minutes of cardio exercises each week such as walking, running, or playing sports.  Staying active and spending no more than 90 minutes at one time being inactive. Even short bursts of exercise are good for you. Three 10-minute sessions spread throughout the day are just as beneficial as a single 30-minute session. Some exercise ideas include:  Taking the dog for a walk.  Taking the stairs instead of the elevator.  Dancing to your favorite song.  Doing an exercise video.  Doing your favorite exercise with a friend. RECOMMENDATIONS FOR EXERCISING WITH TYPE 1 OR TYPE 2 DIABETES   Check your blood glucose before exercising. If  blood glucose levels are greater than 240 mg/dL, check for urine ketones. Do not exercise if ketones are present.  Avoid injecting insulin into areas of the body that are going to be exercised. For example, avoid injecting insulin into:  The arms when playing tennis.  The legs when jogging.  Keep a record of:  Food intake before and after you exercise.  Expected peak times of insulin action.  Blood glucose levels before and after you exercise.  The type and amount of exercise you have done.  Review your records with your health care provider. Your health care provider will help you to develop guidelines for adjusting food intake and insulin amounts before and after exercising.  If you take insulin or oral hypoglycemic agents, watch for signs and symptoms of hypoglycemia. They include:  Dizziness.  Shaking.  Sweating.  Chills.  Confusion.  Drink plenty of water while you exercise to prevent dehydration or heat stroke. Body water is lost during exercise and must be replaced.  Talk to your health care provider before starting an exercise program to make sure it is safe for you. Remember, almost any type of activity is better than none. Document Released: 05/12/2003 Document Revised: 07/06/2013 Document Reviewed: 07/29/2012 ExitCare Patient Information 2015 [employer]. This information is not intended to replace advice given to you by your health care provider. Make sure you discuss any   questions you have with your health care provider. Basic Carbohydrate Counting for Diabetes Mellitus Carbohydrate counting is a method for keeping track of the amount of carbohydrates you eat. Eating carbohydrates naturally increases the level of sugar (glucose) in your blood, so it is important for you to know the amount that is okay for you to have in every meal. Carbohydrate counting helps keep the level of glucose in your blood within normal limits. The amount of carbohydrates allowed is  different for every person. A dietitian can help you calculate the amount that is right for you. Once you know the amount of carbohydrates you can have, you can count the carbohydrates in the foods you want to eat. Carbohydrates are found in the following foods:  Grains, such as breads and cereals.  Dried beans and soy products.  Starchy vegetables, such as potatoes, peas, and corn.  Fruit and fruit juices.  Milk and yogurt.  Sweets and snack foods, such as cake, cookies, candy, chips, soft drinks, and fruit drinks. CARBOHYDRATE COUNTING There are two ways to count the carbohydrates in your food. You can use either of the methods or a combination of both. Reading the "Nutrition Facts" on Packaged Food The "Nutrition Facts" is an area that is included on the labels of almost all packaged food and beverages in the [place]. It includes the serving size of that food or beverage and information about the nutrients in each serving of the food, including the grams (g) of carbohydrate per serving.  Decide the number of servings of this food or beverage that you will be able to eat or drink. Multiply that number of servings by the number of grams of carbohydrate that is listed on the label for that serving. The total will be the amount of carbohydrates you will be having when you eat or drink this food or beverage. Learning Standard Serving Sizes of Food When you eat food that is not packaged or does not include "Nutrition Facts" on the label, you need to measure the servings in order to count the amount of carbohydrates.A serving of most carbohydrate-rich foods contains about 15 g of carbohydrates. The following list includes serving sizes of carbohydrate-rich foods that provide 15 g ofcarbohydrate per serving:   1 slice of bread (1 oz) or 1 six-inch tortilla.    of a hamburger bun or English muffin.  4-6 crackers.   cup unsweetened dry cereal.    cup hot cereal.   cup rice or  pasta.    cup mashed potatoes or  of a large baked potato.  1 cup fresh fruit or one small piece of fruit.    cup canned or frozen fruit or fruit juice.  1 cup milk.   cup plain fat-free yogurt or yogurt sweetened with artificial sweeteners.   cup cooked dried beans or starchy vegetable, such as peas, corn, or potatoes.  Decide the number of standard-size servings that you will eat. Multiply that number of servings by 15 (the grams of carbohydrates in that serving). For example, if you eat 2 cups of strawberries, you will have eaten 2 servings and 30 g of carbohydrates (2 servings x 15 g = 30 g). For foods such as soups and casseroles, in which more than one food is mixed in, you will need to count the carbohydrates in each food that is included. EXAMPLE OF CARBOHYDRATE COUNTING Sample Dinner  3 oz chicken breast.   cup of brown rice.   cup of corn.    1 cup milk.   1 cup strawberries with sugar-free whipped topping.  Carbohydrate Calculation Step 1: Identify the foods that contain carbohydrates:   Rice.   Corn.   Milk.   Strawberries. Step 2:Calculate the number of servings eaten of each:   2 servings of rice.   1 serving of corn.   1 serving of milk.   1 serving of strawberries. Step 3: Multiply each of those number of servings by 15 g:   2 servings of rice x 15 g = 30 g.   1 serving of corn x 15 g = 15 g.   1 serving of milk x 15 g = 15 g.   1 serving of strawberries x 15 g = 15 g. Step 4: Add together all of the amounts to find the total grams of carbohydrates eaten: 30 g + 15 g + 15 g + 15 g = 75 g. Document Released: 02/19/2005 Document Revised: 07/06/2013 Document Reviewed: 01/16/2013 ExitCare Patient Information 2015 [employer]. This information is not intended to replace advice given to you by your health care provider. Make sure you discuss any questions you have with your health care provider.  

## 2014-07-15 ENCOUNTER — Ambulatory Visit: Payer: Self-pay

## 2014-09-01 ENCOUNTER — Other Ambulatory Visit: Payer: Self-pay | Admitting: Internal Medicine

## 2014-09-01 ENCOUNTER — Telehealth: Payer: Self-pay | Admitting: Internal Medicine

## 2014-09-01 NOTE — Telephone Encounter (Signed)
Patient called stating that he currently has a health condition that requires him to have his INR checked and would like to make an appointment to have his coumadin checked. Please let me know if it is okay to schedule appointment.

## 2014-09-09 ENCOUNTER — Ambulatory Visit: Payer: Self-pay | Attending: Internal Medicine | Admitting: Pharmacist

## 2014-09-09 DIAGNOSIS — I7771 Dissection of carotid artery: Secondary | ICD-10-CM

## 2014-09-09 LAB — POCT INR: INR: 1

## 2014-09-09 MED ORDER — WARFARIN SODIUM 5 MG PO TABS: ORAL_TABLET | ORAL | Status: DC

## 2014-09-14 ENCOUNTER — Other Ambulatory Visit: Payer: Self-pay | Admitting: Internal Medicine

## 2014-09-14 ENCOUNTER — Ambulatory Visit: Payer: Self-pay | Attending: Internal Medicine | Admitting: Pharmacist

## 2014-09-14 ENCOUNTER — Encounter: Payer: Self-pay | Admitting: Pharmacist

## 2014-09-14 DIAGNOSIS — I7771 Dissection of carotid artery: Secondary | ICD-10-CM

## 2014-09-14 LAB — POCT INR: INR: 1.8

## 2014-09-14 NOTE — Patient Instructions (Signed)
Great to see you today!  Go home and take an extra half of a tablet for a total dose today of 10 mg.  Start taking 2 tablets tomorrow everyday except Sundays and Mondays.

## 2014-09-21 ENCOUNTER — Encounter: Payer: Self-pay | Admitting: Pharmacist

## 2014-09-21 ENCOUNTER — Ambulatory Visit: Payer: No Typology Code available for payment source | Attending: Internal Medicine | Admitting: Pharmacist

## 2014-09-21 DIAGNOSIS — I7771 Dissection of carotid artery: Secondary | ICD-10-CM

## 2014-09-21 LAB — POCT INR: INR: 2.8

## 2014-09-21 NOTE — Patient Instructions (Signed)
Great to see you today Mr. Jason Hall!  If you have any unusual bruising or bleeding, please let us know.  I will see you again August 2nd at 3pm

## 2014-10-05 ENCOUNTER — Ambulatory Visit: Payer: Self-pay | Attending: Internal Medicine | Admitting: Pharmacist

## 2014-10-05 DIAGNOSIS — I7771 Dissection of carotid artery: Secondary | ICD-10-CM

## 2014-10-05 LAB — POCT INR: INR: 2

## 2014-10-28 ENCOUNTER — Ambulatory Visit: Payer: No Typology Code available for payment source | Attending: Internal Medicine | Admitting: Pharmacist

## 2014-10-28 DIAGNOSIS — I7771 Dissection of carotid artery: Secondary | ICD-10-CM

## 2014-10-28 LAB — POCT INR: INR: 1.9

## 2014-10-28 NOTE — Progress Notes (Addendum)
54 YOM on chronic anticoagulation on warfarin for Carotid artery dissection with an INR goal of 2-3.  INR is slightly below therapeutic range at 1.9.   Pt reports no changes in meds, no s/sx of bleeding, or any changes in diet. Will give extra 2.5 mg today and then continue current regimen of 10 mg daily except 7.5 mg on Sunday and Monday.  Will F/U INR in 2 weeks.    I agree with the resident's note as documented.  Juanita Craver, PharmD, BCPS, CPP Clinical Pharmacist

## 2014-11-11 ENCOUNTER — Ambulatory Visit: Payer: No Typology Code available for payment source | Attending: Internal Medicine | Admitting: Pharmacist

## 2014-11-11 DIAGNOSIS — I7771 Dissection of carotid artery: Secondary | ICD-10-CM

## 2014-11-11 LAB — POCT INR: INR: 6.4

## 2014-11-16 ENCOUNTER — Ambulatory Visit: Payer: Self-pay | Attending: Internal Medicine | Admitting: Pharmacist

## 2014-11-16 DIAGNOSIS — I7771 Dissection of carotid artery: Secondary | ICD-10-CM

## 2014-11-16 LAB — POCT INR: INR: 1.2

## 2014-11-16 NOTE — Progress Notes (Signed)
Pharmacy Anticoagulation Clinic  Subjective: Patient presents today for INR monitoring.   Current dose of warfarin: warfarin was held Friday, Saturday, and Sunday due to elevated INR last week. He was only supposed to hold it Friday and Saturday but held it Sunday due to bruising.  Adherence to warfarin: patient did not take warfarin on Sunday due to fear of bleeding from large bruise Signs/symptoms of bleeding: large bruise on thigh Recent changes in diet: none Recent changes in medications: none Upcoming procedures that may impact anticoagulation: none   Objective: Today's INR = 1.2  Large bruise on right thigh. Appears to be healing. No open wounds. No swelling.    Assessment and Plan: Anticoagulation: Patient is Subtherapeutic based on patient's INR of 1.2 and patient's INR goal of 2-3. Will restart current warfarin dose: 1.5 tablets on Sunday and Monday, and 2 tablets all other days. Patient has 5 mg tablets. Patient refuses to boost with extra warfarin due to bruising. Instructed patient to return to clinic with any additional s/sx of bleeding unless it is an emergency and then he needs to go to the emergency room.   Patient verbalized understanding and was provided with written instructions. Next INR check planned for 1 week

## 2014-11-30 ENCOUNTER — Ambulatory Visit: Payer: Self-pay | Attending: Internal Medicine | Admitting: Pharmacist

## 2014-11-30 DIAGNOSIS — I7771 Dissection of carotid artery: Secondary | ICD-10-CM

## 2014-11-30 LAB — POCT INR: INR: 2

## 2014-12-07 ENCOUNTER — Ambulatory Visit: Payer: Self-pay | Attending: Internal Medicine | Admitting: Pharmacist

## 2014-12-07 DIAGNOSIS — I7771 Dissection of carotid artery: Secondary | ICD-10-CM

## 2014-12-07 LAB — POCT INR: INR: 2.7

## 2014-12-21 ENCOUNTER — Encounter: Payer: Self-pay | Admitting: Vascular Surgery

## 2014-12-21 ENCOUNTER — Ambulatory Visit: Payer: Self-pay | Attending: Internal Medicine | Admitting: Pharmacist

## 2014-12-21 DIAGNOSIS — I7771 Dissection of carotid artery: Secondary | ICD-10-CM

## 2014-12-21 LAB — POCT INR: INR: 1.3

## 2014-12-23 ENCOUNTER — Other Ambulatory Visit: Payer: Self-pay | Admitting: Vascular Surgery

## 2014-12-23 ENCOUNTER — Encounter: Payer: Self-pay | Admitting: Vascular Surgery

## 2014-12-23 ENCOUNTER — Ambulatory Visit (HOSPITAL_COMMUNITY)
Admission: RE | Admit: 2014-12-23 | Discharge: 2014-12-23 | Disposition: A | Discharge status: Home / Self Care | Payer: Self-pay | Source: Ambulatory Visit | Attending: Vascular Surgery | Admitting: Vascular Surgery

## 2014-12-23 ENCOUNTER — Ambulatory Visit (INDEPENDENT_AMBULATORY_CARE_PROVIDER_SITE_OTHER): Payer: Self-pay | Admitting: Vascular Surgery

## 2014-12-23 VITALS — BP 139/77 | HR 85 | Temp 98.3°F | Resp 18 | Ht 70.0 in | Wt 344.0 lb

## 2014-12-23 DIAGNOSIS — I7771 Dissection of carotid artery: Secondary | ICD-10-CM

## 2014-12-23 DIAGNOSIS — I6523 Occlusion and stenosis of bilateral carotid arteries: Secondary | ICD-10-CM | POA: Insufficient documentation
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
## 2014-12-28 ENCOUNTER — Telehealth: Payer: Self-pay | Admitting: Pharmacist

## 2014-12-28 ENCOUNTER — Encounter: Payer: Self-pay | Admitting: Pharmacist

## 2014-12-28 NOTE — Telephone Encounter (Signed)
Called patient to see if he was planning on coming to appointment today in Coumadin Clinic. He saw Dr. Darrick PennaFields last week and can stop anticoagulation so he does not need to come see me anymore. Unable to reach him so left a message.    Juanita CraverStacey Karl, PharmD, BCPS, CPP Clinical Pharmacist Practitioner  [facility] [phone]

## 2014-12-28 NOTE — Telephone Encounter (Signed)
Patient returned call. He reports that he has stopped the warfarin but he wasn't sure if we knew. I told him that he no longer needed to follow up with me since he was off of the warfarin. Patient verbalized understanding.  Cancelled patient's appointment in Coumadin Clinic for today. Will discontinue warfarin from his medication list.    Juanita CraverStacey Karl, PharmD, BCPS, CPP Clinical Pharmacist Practitioner  [facility] [phone]

## 2015-01-26 ENCOUNTER — Other Ambulatory Visit: Payer: Self-pay | Admitting: Internal Medicine

## 2015-01-26 DIAGNOSIS — I10 Essential (primary) hypertension: Secondary | ICD-10-CM

## 2015-01-26 NOTE — Telephone Encounter (Signed)
Nurse called patient, reached voicemail. Left message for patient to call Neri [facility]k with [facility], at [phone]. Nurse called patient to make him aware of need for appointment prior to additional refills.  Nurse sent 1 month supply of hctz, amlodipine and lisinopril to pharmacy.

## 2015-02-10 ENCOUNTER — Telehealth: Payer: Self-pay | Admitting: Internal Medicine

## 2015-02-10 NOTE — Telephone Encounter (Signed)
Patient dropped off paperwork to be filled out by provider so that he is able to drive for another year. Pt mentioned that he would need this before christmas break. Please follow up with pt once completed and [facility]. Thank you. Paperwork was placed in provider folder.

## 2015-02-18 ENCOUNTER — Encounter: Payer: Self-pay | Admitting: Internal Medicine

## 2015-02-18 ENCOUNTER — Ambulatory Visit: Payer: Self-pay | Attending: Internal Medicine | Admitting: Internal Medicine

## 2015-02-18 ENCOUNTER — Encounter (HOSPITAL_BASED_OUTPATIENT_CLINIC_OR_DEPARTMENT_OTHER): Payer: Self-pay | Admitting: Clinical

## 2015-02-18 VITALS — BP 142/84 | HR 84 | Temp 98.2°F | Resp 16 | Ht 69.0 in | Wt 351.4 lb

## 2015-02-18 DIAGNOSIS — Z9119 Patient's noncompliance with other medical treatment and regimen: Secondary | ICD-10-CM | POA: Insufficient documentation

## 2015-02-18 DIAGNOSIS — E079 Disorder of thyroid, unspecified: Secondary | ICD-10-CM | POA: Insufficient documentation

## 2015-02-18 DIAGNOSIS — E785 Hyperlipidemia, unspecified: Secondary | ICD-10-CM | POA: Insufficient documentation

## 2015-02-18 DIAGNOSIS — Z79899 Other long term (current) drug therapy: Secondary | ICD-10-CM | POA: Insufficient documentation

## 2015-02-18 DIAGNOSIS — F419 Anxiety disorder, unspecified: Principal | ICD-10-CM

## 2015-02-18 DIAGNOSIS — F329 Major depressive disorder, single episode, unspecified: Secondary | ICD-10-CM

## 2015-02-18 DIAGNOSIS — Z0000 Encounter for general adult medical examination without abnormal findings: Secondary | ICD-10-CM | POA: Insufficient documentation

## 2015-02-18 DIAGNOSIS — E1165 Type 2 diabetes mellitus with hyperglycemia: Secondary | ICD-10-CM | POA: Insufficient documentation

## 2015-02-18 DIAGNOSIS — I10 Essential (primary) hypertension: Secondary | ICD-10-CM | POA: Insufficient documentation

## 2015-02-18 DIAGNOSIS — F418 Other specified anxiety disorders: Secondary | ICD-10-CM

## 2015-02-18 DIAGNOSIS — E119 Type 2 diabetes mellitus without complications: Secondary | ICD-10-CM

## 2015-02-18 DIAGNOSIS — J449 Chronic obstructive pulmonary disease, unspecified: Secondary | ICD-10-CM | POA: Insufficient documentation

## 2015-02-18 DIAGNOSIS — Z794 Long term (current) use of insulin: Secondary | ICD-10-CM | POA: Insufficient documentation

## 2015-02-18 DIAGNOSIS — Z7984 Long term (current) use of oral hypoglycemic drugs: Secondary | ICD-10-CM | POA: Insufficient documentation

## 2015-02-18 LAB — POCT GLYCOSYLATED HEMOGLOBIN (HGB A1C): Hemoglobin A1C: 10.9

## 2015-02-18 LAB — GLUCOSE, POCT (MANUAL RESULT ENTRY): POC Glucose: 240 mg/dl — AB (ref 70–99)

## 2015-02-18 MED ORDER — HYDROCHLOROTHIAZIDE 25 MG PO TABS: 25.0000 mg | ORAL_TABLET | Freq: Every day | ORAL | Status: DC

## 2015-02-18 MED ORDER — OMEGA-3-ACID ETHYL ESTERS 1 G PO CAPS: 2.0000 g | ORAL_CAPSULE | Freq: Two times a day (BID) | ORAL | Status: DC

## 2015-02-18 MED ORDER — AMLODIPINE BESYLATE 10 MG PO TABS: 10.0000 mg | ORAL_TABLET | Freq: Every day | ORAL | Status: DC

## 2015-02-18 MED ORDER — LISINOPRIL 40 MG PO TABS: 40.0000 mg | ORAL_TABLET | Freq: Every day | ORAL | Status: DC

## 2015-02-18 MED ORDER — METFORMIN HCL 1000 MG PO TABS: 1000.0000 mg | ORAL_TABLET | Freq: Two times a day (BID) | ORAL | Status: DC

## 2015-02-18 MED ORDER — GLIPIZIDE 10 MG PO TABS: ORAL_TABLET | ORAL | Status: DC

## 2015-02-18 MED ORDER — INSULIN PEN NEEDLE 32G X 4 MM MISC: Status: DC

## 2015-02-18 NOTE — Progress Notes (Signed)
Patient ID: Jason Hall, male   DOB: 1960-12-05, 54 y.o.   MRN: [id number] SUBJECTIVE: 54 y.o. male for follow up of diabetes and hypertension. Patient has a past medical history of carotid artery dissection and COPD. He has recently been discontinued off of coumadin by his vascular surgeon. He has not been evaluated by me in several months. He states that he has not checked his sugars in several months because he has been out of strips. He states that he uses 50 units of Levemir twice per day without any skipped doses. He does not follow a diabetic diet. He denies polyuria, neuropathy, or blurred vision. Patient is unable to explain which medications he has taken today for blood pressure.  Current Outpatient Prescriptions  Medication Sig Dispense Refill  . acetaminophen (TYLENOL) 325 MG tablet Take 2 tablets (650 mg total) by mouth every 6 (six) hours as needed for moderate pain.    Marland Kitchen amLODipine (NORVASC) 10 MG tablet TAKE 1 TABLET BY MOUTH DAILY. 30 tablet 0  . atorvastatin (LIPITOR) 40 MG tablet Take 1 tablet (40 mg total) by mouth daily. 90 tablet 2  . busPIRone (BUSPAR) 10 MG tablet Take 20 mg by mouth 3 (three) times daily.    Marland Kitchen glipiZIDE (GLUCOTROL) 10 MG tablet TAKE 1 TABLET BY MOUTH 2 TIMES DAILY BEFORE A MEAL 60 tablet 1  . glucose blood test strip Use as instructed 100 each 12  . glucose monitoring kit (FREESTYLE) monitoring kit 1 each by Does not apply route as needed for other. 1 each 0  . hydrochlorothiazide (HYDRODIURIL) 25 MG tablet TAKE 1 TABLET BY MOUTH DAILY. 30 tablet 0  . insulin detemir (LEVEMIR) 100 UNIT/ML injection Inject 0.5 mLs (50 Units total) into the skin 2 (two) times daily. 10 mL 6  . Insulin Syringe-Needle U-100 (INSULIN SYRINGE 1CC/28G) 28G X 1/2" 1 ML MISC Used as instructed by PCP 1 each 11  . Lancets (FREESTYLE) lancets Use as instructed 100 each 12  . levothyroxine (SYNTHROID, LEVOTHROID) 75 MCG tablet Take 1 tablet (75 mcg total) by mouth daily before  breakfast. 90 tablet 2  . lisinopril (PRINIVIL,ZESTRIL) 40 MG tablet TAKE 1 TABLET BY MOUTH DAILY. 30 tablet 0  . metFORMIN (GLUCOPHAGE) 1000 MG tablet Take 1 tablet (1,000 mg total) by mouth 2 (two) times daily with a meal. 60 tablet 6  . sertraline (ZOLOFT) 100 MG tablet Take 100 mg by mouth 2 (two) times daily.    . traZODone (DESYREL) 100 MG tablet Take 300 mg by mouth at bedtime.     No current facility-administered medications for this visit.    OBJECTIVE: Appearance: alert, well appearing, and in no distress, oriented to person, place, and time and overweight. BP 142/84 mmHg  Pulse 84  Temp(Src) 98.2 F (36.8 C) (Oral)  Resp 16  Ht 5' 9"  (1.753 m)  Wt 351 lb 6.4 oz (159.394 kg)  BMI 51.87 kg/m2  SpO2 94%  Physical Exam  Constitutional: He is oriented to person, place, and time.  Neck: No JVD present.  Cardiovascular: Normal rate, regular rhythm and normal heart sounds.   Pulses:      Dorsalis pedis pulses are 2+ on the right side, and 2+ on the left side.       Posterior tibial pulses are 2+ on the right side, and 2+ on the left side.  Pulmonary/Chest: Effort normal and breath sounds normal.  Feet:  Right Foot:  Protective Sensation: 10 sites tested.10 sites sensed. Left Foot:  Protective  Sensation: 10 sites tested. 10 sites sensed. Neurological: He is alert and oriented to person, place, and time.  Skin:  Long thick toenails. Thick/dry skin, dirty skin     ASSESSMENT: Tami was seen today for follow-up.  Diagnoses and all orders for this visit:  Diabetes mellitus without complication (HCC) -     Glucose (CBG) -     HgB A1c -     Insulin Pen Needle (ULTICARE MICRO PEN NEEDLES) 32G X 4 MM MISC; Give insulin twice per day for E11.9 -     metFORMIN (GLUCOPHAGE) 1000 MG tablet; Take 1 tablet (1,000 mg total) by mouth 2 (two) times daily with a meal. -     glipiZIDE (GLUCOTROL) 10 MG tablet; TAKE 1 TABLET BY MOUTH 2 TIMES DAILY BEFORE A MEAL Patients diabetes  remains uncontrolled as evidence by hemoglobin a1c >8.  Patient has been non-compliant with medication regimen. Stressed the multiple complications associated with uncontrolled diabetes.  Patient will stay on current medication dose and report back to clinic with cbg log in 2 weeks.  Essential hypertension -     lisinopril (PRINIVIL,ZESTRIL) 40 MG tablet; Take 1 tablet (40 mg total) by mouth daily. -     COMPLETE METABOLIC PANEL WITH GFR -     hydrochlorothiazide (HYDRODIURIL) 25 MG tablet; Take 1 tablet (25 mg total) by mouth daily. -     amLODipine (NORVASC) 10 MG tablet; Take 1 tablet (10 mg total) by mouth daily. Patient blood pressure remain elevated due to probably non-compliance. Stressed diet changes, regular exercise regimen, and modifiable risk factors. Will follow up with [facility] as needed, Will follow up with patient in 3-6 months.   Hyperlipidemia -     omega-3 acid ethyl esters (LOVAZA) 1 G capsule; Take 2 capsules (2 g total) by mouth 2 (two) times daily. To help lower triglycerides Went over diet changes and risk of elevated triglycerides.   Thyroid disease -     TSH -     T4, Free Will check and adjust medications accordingly.   Health care maintenance -     Flu Vaccine QUAD 36+ mos PF IM (Fluarix & Fluzone Quad PF)   Return in about 2 weeks (around 03/04/2015) for stacy log review and 3 mo PCP .   Lance Bosch, NP 03/02/2015 4:31 PM

## 2015-02-18 NOTE — Progress Notes (Signed)
ASSESSMENT: Pt currently experiencing symptoms of anxiety and depression. Pt needs to f/u with PCP; may benefit from psychoeducation and supportive counseling regarding coping with symptoms of anxiety and depression.  Stage of Change: precontemplative  PLAN: 1. F/U with behavioral health consultant in two  weeks 2. Psychiatric Medications: zoloft. 3. Behavioral recommendation(s):   -Come in to [facility] on walk-in day to see financial counseling -Consider reading educational material regarding coping with symptoms of anxiety and depression SUBJECTIVE: Pt. referred by Holland CommonsValerie Keck, NP for symptoms of anxiety and depression:  Pt. reports the following symptoms/concerns: Pt does not acknowledge anxiety and depression, primary concern is getting DMV paperwork completed to be able to drive, coming back to see financial counselors.  Duration of problem: (undetermined anxiety/depression symptoms)/ one week for DMV/financial counseling Severity: moderate  OBJECTIVE: Orientation & Cognition: Oriented x3. Thought processes normal and appropriate to situation. Mood: elated. Affect: appropriate Appearance: appropriate Risk of harm to self or others: no known risk of harm to self or others Substance use: none  Assessments administered: PHQ9: 17/ GAD7: 17  Diagnosis: Anxiety and depression CPT Code: F41.8 -------------------------------------------- Other(s) present in the room: none  Time spent with patient in exam room: 10 minutes

## 2015-02-18 NOTE — Patient Instructions (Signed)
I have added Fish oil to your medications this will help lower your triglycerides   You will come back in 2 weeks with your sugar log for review  I want you to come back to apply for the [employer] discount so that I can send you to a foot doctor for your toe nails and a diabetic specialist

## 2015-02-18 NOTE — Progress Notes (Signed)
Patient here for F/U for DM. Patient CBG is 240. Patient denies any pain today. Patient needs a refill on test strips and pen needles. Patient has taken his medications today. Patient requests a flu shot.

## 2015-02-19 LAB — COMPLETE METABOLIC PANEL WITH GFR
ALT: 38 U/L (ref 9–46)
AST: 24 U/L (ref 10–35)
Albumin: 3.7 g/dL (ref 3.6–5.1)
Alkaline Phosphatase: 71 U/L (ref 40–115)
BILIRUBIN TOTAL: 0.3 mg/dL (ref 0.2–1.2)
BUN: 11 mg/dL (ref 7–25)
CHLORIDE: 102 mmol/L (ref 98–110)
CO2: 26 mmol/L (ref 20–31)
CREATININE: 0.63 mg/dL — AB (ref 0.70–1.33)
Calcium: 9.2 mg/dL (ref 8.6–10.3)
GFR, Est African American: >89 mL/min (ref >=60)
GFR, Est Non African American: >89 mL/min (ref >=60)
Glucose, Bld: 202 mg/dL — ABNORMAL HIGH (ref 65–99)
Potassium: 4.7 mmol/L (ref 3.5–5.3)
Sodium: 141 mmol/L (ref 135–146)
TOTAL PROTEIN: 6.7 g/dL (ref 6.1–8.1)

## 2015-02-19 LAB — T4, FREE: FREE T4: 0.9 ng/dL (ref 0.80–1.80)

## 2015-02-19 LAB — TSH: TSH: 2.588 u[IU]/mL (ref 0.350–4.500)

## 2015-02-24 ENCOUNTER — Telehealth: Payer: Self-pay

## 2015-02-24 NOTE — Telephone Encounter (Signed)
Tried to call patient  Patient not available Unable to leave message No voice mail

## 2015-02-24 NOTE — Telephone Encounter (Signed)
-----   Message from Ambrose FinlandValerie A Keck, NP sent at 02/23/2015  5:31 PM EST ----- Labs are within normal limits

## 2015-02-25 ENCOUNTER — Encounter: Payer: Self-pay | Admitting: Internal Medicine

## 2015-02-25 NOTE — Telephone Encounter (Signed)
I am closing these encounters as I am responsible for the Coumadin Clinic now and the provider at this time is no longer employed at our facility to close them. I did not provide this service on this date.   Stacey Karl, PharmD, BCPS, CPP Clinical Pharmacist Practitioner  [facility] [phone]  

## 2015-02-25 NOTE — Progress Notes (Signed)
Patient ID: Jason Hall, male   DOB: Feb 14, 1961, 54 y.o.   MRN: [id number] Encounter open by RN in error

## 2015-02-25 NOTE — Progress Notes (Signed)
I am closing these encounters as I am responsible for the Coumadin Clinic now and the provider at this time is no longer employed at our facility to close them. I did not provide this service on this date.   Georgio Hattabaugh Karl, PharmD, BCPS, CPP Clinical Pharmacist Practitioner  [facility] [phone]  

## 2015-03-01 ENCOUNTER — Other Ambulatory Visit: Payer: Self-pay | Admitting: Pharmacist

## 2015-03-03 ENCOUNTER — Encounter: Payer: Self-pay | Admitting: Internal Medicine

## 2015-03-03 ENCOUNTER — Telehealth: Payer: Self-pay | Admitting: Pharmacist

## 2015-03-03 ENCOUNTER — Other Ambulatory Visit: Payer: Self-pay | Admitting: Internal Medicine

## 2015-03-03 DIAGNOSIS — E039 Hypothyroidism, unspecified: Secondary | ICD-10-CM | POA: Insufficient documentation

## 2015-03-03 NOTE — Telephone Encounter (Signed)
Called patient as we received a refill request for Lantus. Patient is supposed to be taking Levemir 50 units BID per our records. Wanted to confirm with patient what he was taking. I was unable to reach him and unable to leave a message.

## 2015-03-11 ENCOUNTER — Telehealth: Payer: Self-pay

## 2015-03-11 NOTE — Telephone Encounter (Signed)
Returned call to patient  Patient not available Unable to leave message-no voice mail on number

## 2015-03-11 NOTE — Telephone Encounter (Signed)
Pt. Came in today requesting to know if his DMV paperwork [facility]. Pt. Dropped off another form for the provider. Please f/u with pt.

## 2015-03-15 ENCOUNTER — Encounter: Payer: Self-pay | Admitting: Pharmacist

## 2015-04-18 ENCOUNTER — Telehealth: Payer: Self-pay

## 2015-04-18 MED FILL — HYDROCHLOROTHIAZIDE 25 MG T: 25 | 30 days supply | Qty: 30 | Fill #1

## 2015-04-18 MED FILL — ULTICARE PEN NDL 4MM 32G: 32G X 4 MM | 50 days supply | Qty: 100 | Fill #1

## 2015-04-18 MED FILL — LISINOPRIL 40 MG TABLET: 40 | 30 days supply | Qty: 30 | Fill #1

## 2015-04-18 MED FILL — metFORMIN HCL 1000 MG TABS: 1000 | 30 days supply | Qty: 60 | Fill #1

## 2015-04-18 MED FILL — AMLODIPINE BESYLATE 10 MG T: 10 | 30 days supply | Qty: 30 | Fill #1

## 2015-04-18 MED FILL — OMEGA-3 ETHYL ESTERS 1 GM C: 1 | 15 days supply | Qty: 60 | Fill #1

## 2015-04-18 MED FILL — glipiZIDE 10 MG TABS: 10 | 30 days supply | Qty: 60 | Fill #1
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
## 2015-04-21 ENCOUNTER — Other Ambulatory Visit: Payer: Self-pay | Admitting: Pharmacist

## 2015-04-21 MED ORDER — GLUCOSE BLOOD VI STRP: ORAL_STRIP | Status: DC

## 2015-04-21 MED ORDER — TRUE METRIX METER W/DEVICE KIT: PACK | Status: AC

## 2015-04-21 MED ORDER — TRUEPLUS LANCETS 28G MISC: Status: DC

## 2015-04-21 MED FILL — TRUE METRIX TEST STRIP: 25 days supply | Qty: 100 | Fill #0

## 2015-04-21 MED FILL — TRUEplus LANCETS 28G MISC: 25 days supply | Qty: 100 | Fill #0

## 2015-04-21 MED FILL — !TRUE METRIX BLOOD GLUCOSE: 365 days supply | Qty: 1 | Fill #0

## 2015-04-29 ENCOUNTER — Ambulatory Visit: Payer: Self-pay | Attending: Internal Medicine

## 2015-04-29 MED FILL — !LEVEMIR FLEXPEN 100UNITS/M: 100U/ML (3) | 15 days supply | Qty: 15 | Fill #1

## 2015-05-05 ENCOUNTER — Ambulatory Visit: Payer: Self-pay | Attending: Internal Medicine | Admitting: Internal Medicine

## 2015-05-05 ENCOUNTER — Encounter: Payer: Self-pay | Admitting: Internal Medicine

## 2015-05-05 VITALS — BP 152/87 | HR 77 | Temp 97.6°F | Resp 17 | Ht 70.8 in | Wt 341.0 lb

## 2015-05-05 DIAGNOSIS — R824 Acetonuria: Secondary | ICD-10-CM

## 2015-05-05 DIAGNOSIS — Z794 Long term (current) use of insulin: Secondary | ICD-10-CM | POA: Insufficient documentation

## 2015-05-05 DIAGNOSIS — Z9114 Patient's other noncompliance with medication regimen: Secondary | ICD-10-CM | POA: Insufficient documentation

## 2015-05-05 DIAGNOSIS — R739 Hyperglycemia, unspecified: Secondary | ICD-10-CM

## 2015-05-05 DIAGNOSIS — E1165 Type 2 diabetes mellitus with hyperglycemia: Secondary | ICD-10-CM | POA: Insufficient documentation

## 2015-05-05 DIAGNOSIS — Z9111 Patient's noncompliance with dietary regimen: Secondary | ICD-10-CM | POA: Insufficient documentation

## 2015-05-05 DIAGNOSIS — IMO0001 Reserved for inherently not codable concepts without codable children: Secondary | ICD-10-CM

## 2015-05-05 DIAGNOSIS — Z79899 Other long term (current) drug therapy: Secondary | ICD-10-CM | POA: Insufficient documentation

## 2015-05-05 LAB — GLUCOSE, POCT (MANUAL RESULT ENTRY)
POC GLUCOSE: 475 mg/dL — AB (ref 70–99)
POC GLUCOSE: 381 mg/dL — AB (ref 70–99)

## 2015-05-05 LAB — POCT URINALYSIS DIPSTICK
BILIRUBIN UA: NEGATIVE
GLUCOSE UA: 500
Leukocytes, UA: NEGATIVE
Nitrite, UA: NEGATIVE
PH UA: 6
Protein, UA: NEGATIVE
UROBILINOGEN UA: 0.2

## 2015-05-05 LAB — POCT GLYCOSYLATED HEMOGLOBIN (HGB A1C): HEMOGLOBIN A1C: 11.2

## 2015-05-05 MED ORDER — INSULIN ASPART 100 UNIT/ML ~~LOC~~ SOLN
20.0000 [IU] | Freq: Once | SUBCUTANEOUS | Status: AC
  Administered 2015-05-05: 20 [IU] via SUBCUTANEOUS

## 2015-05-05 MED ORDER — SODIUM CHLORIDE 0.9 % IV SOLN
Freq: Once | INTRAVENOUS | Status: AC
  Administered 2015-05-05: 16:00:00 via INTRAVENOUS

## 2015-05-05 NOTE — Progress Notes (Signed)
Patient ID: Jason Hall, male   DOB: 1960/10/28, 55 y.o.   MRN: [id number] SUBJECTIVE: 55 y.o. male for follow up of diabetes. Patient reports that for the past couple of months he has been taking expired insulin. He states that while taking expired insulin he did not check his sugars. He states that five days ago he received new insulin but still has not checked his sugar levels. He notes that before he came he had one episode of loose stools so he drank a large coke to help his stomach and ate a ham sandwich.    Current Outpatient Prescriptions  Medication Sig Dispense Refill  . acetaminophen (TYLENOL) 325 MG tablet Take 2 tablets (650 mg total) by mouth every 6 (six) hours as needed for moderate pain.    Marland Kitchen amLODipine (NORVASC) 10 MG tablet Take 1 tablet (10 mg total) by mouth daily. 30 tablet 5  . atorvastatin (LIPITOR) 40 MG tablet Take 1 tablet (40 mg total) by mouth daily. 90 tablet 2  . busPIRone (BUSPAR) 10 MG tablet Take 20 mg by mouth 3 (three) times daily.    Marland Kitchen glipiZIDE (GLUCOTROL) 10 MG tablet TAKE 1 TABLET BY MOUTH 2 TIMES DAILY BEFORE A MEAL 60 tablet 5  . hydrochlorothiazide (HYDRODIURIL) 25 MG tablet Take 1 tablet (25 mg total) by mouth daily. 30 tablet 5  . insulin detemir (LEVEMIR) 100 UNIT/ML injection Inject 0.5 mLs (50 Units total) into the skin 2 (two) times daily. 10 mL 6  . levothyroxine (SYNTHROID, LEVOTHROID) 75 MCG tablet Take 1 tablet (75 mcg total) by mouth daily before breakfast. 90 tablet 2  . lisinopril (PRINIVIL,ZESTRIL) 40 MG tablet Take 1 tablet (40 mg total) by mouth daily. 30 tablet 5  . metFORMIN (GLUCOPHAGE) 1000 MG tablet Take 1 tablet (1,000 mg total) by mouth 2 (two) times daily with a meal. 60 tablet 6  . omega-3 acid ethyl esters (LOVAZA) 1 G capsule Take 2 capsules (2 g total) by mouth 2 (two) times daily. To help lower triglycerides 60 capsule 5  . sertraline (ZOLOFT) 100 MG tablet Take 100 mg by mouth 2 (two) times daily.    . traZODone (DESYREL) 100  MG tablet Take 300 mg by mouth at bedtime.    . Blood Glucose Monitoring Suppl (TRUE METRIX METER) w/Device KIT USE AS INSTRUCTED 1 kit 0  . glucose blood (TRUE METRIX BLOOD GLUCOSE TEST) test strip Use as instructed 100 each 12  . Insulin Pen Needle (ULTICARE MICRO PEN NEEDLES) 32G X 4 MM MISC Give insulin twice per day for E11.9 100 each 12  . Insulin Syringe-Needle U-100 (INSULIN SYRINGE 1CC/28G) 28G X 1/2" 1 ML MISC Used as instructed by PCP 1 each 11  . TRUEPLUS LANCETS 28G MISC USE AS INSTRUCTED 100 each 5   No current facility-administered medications for this visit.  Review of Systems  All other systems reviewed and are negative.   OBJECTIVE: Appearance: alert, well appearing, and in no distress, oriented to person, place, and time and overweight. BP 152/87 mmHg  Pulse 77  Temp(Src) 97.6 F (36.4 C)  Resp 17  Ht 5' 10.8" (1.798 m)  Wt 341 lb (154.677 kg)  BMI 47.85 kg/m2  SpO2 100%  Exam: heart sounds normal rate, regular rhythm, normal S1, S2, no murmurs, rubs, clicks or gallops, no JVD, chest clear, no hepatosplenomegaly, no carotid bruits. Morbidly obese  ASSESSMENT: Jason Hall was seen today for follow-up.  Diagnoses and all orders for this visit:  Hyperglycemia -  insulin aspart (novoLOG) injection 20 Units; Inject 0.2 mLs (20 Units total) into the skin once in office Likely due to diet non compliance and using out of date insulin. Patient has very low literacy and I am very concerned that patient is not taking insulin as prescribed. I do not feel he understands the importance of checking sugars while on insulin and importance of strict glycemic control. I have again addressed these issues with patient in detail.   Ketonuria IV started and 1 L NS given to patient to help lower sugar and correct ketonuria.   Uncontrolled type 2 diabetes mellitus without complication, without long-term current use of insulin (HCC) -     Glucose (CBG) -     HgB A1c -     Urinalysis  Dipstick -     Ambulatory referral to Endocrinology See above. At this point I am out of ideas on how to help get patiens diabetes controlled. I will refer him to Endocrinology for further evaluation and management. I will have him to meet with PharmD for a log review until he is able to get in with Endocrinology.   PLAN: See orders for this visit as documented in the electronic medical record. Issues reviewed with him: diabetic diet discussed in detail, written exchange diet given, low cholesterol diet, weight control and daily exercise discussed, glucose meter dispensed to patient, foot care discussed and Podiatry visits discussed and annual eye examinations at Ophthalmology discussed.  Return in about 2 weeks (around 05/19/2015).   Lance Bosch, NP 05/05/2015 3:32 PM

## 2015-05-05 NOTE — Progress Notes (Signed)
Patient here for follow up on his diabetes and HTn Presents in office with elevated blood sugar 20 units novolog given per office protocol

## 2015-05-19 ENCOUNTER — Ambulatory Visit: Payer: Self-pay | Attending: Internal Medicine | Admitting: Pharmacist

## 2015-05-19 DIAGNOSIS — E1165 Type 2 diabetes mellitus with hyperglycemia: Secondary | ICD-10-CM

## 2015-05-19 DIAGNOSIS — E119 Type 2 diabetes mellitus without complications: Secondary | ICD-10-CM | POA: Insufficient documentation

## 2015-05-19 DIAGNOSIS — Z794 Long term (current) use of insulin: Secondary | ICD-10-CM | POA: Insufficient documentation

## 2015-05-19 DIAGNOSIS — G629 Polyneuropathy, unspecified: Secondary | ICD-10-CM | POA: Insufficient documentation

## 2015-05-19 DIAGNOSIS — R351 Nocturia: Secondary | ICD-10-CM | POA: Insufficient documentation

## 2015-05-19 DIAGNOSIS — IMO0001 Reserved for inherently not codable concepts without codable children: Secondary | ICD-10-CM

## 2015-05-19 LAB — GLUCOSE, POCT (MANUAL RESULT ENTRY): POC GLUCOSE: 184 mg/dL — AB (ref 70–99)

## 2015-05-19 NOTE — Progress Notes (Signed)
S:    Patient arrives in good spirits.  Presents for diabetes evaluation, education, and management at the request of [name]. Patient was referred on 05/05/15.  Patient was last seen by Primary Care Provider on 05/05/15.   Patient reports adherence with medications.  Current diabetes medications include: metformin 1000 mg BID, glipizide 10 mg BID, Levemir 50 units BID  Patient denies hypoglycemic events.   Patient reports nocturia.  Patient reports neuropathy. Patient denies visual changes. Patient reports self foot exams.   Patient reports that he is still trying to get more insulin because the insulin that he currently has expires this month.    O:  Lab Results  Component Value Date   HGBA1C 11.2 05/05/2015   There were no vitals filed for this visit.  Home fasting CBG: 170s-200s 2 hour post-prandial/random CBG: no readings  POCT glucose = 184  A/P: Diabetes longstanding currently uncontrolled based on A1c of 11.2. Patient denies hypoglycemic events and is able to verbalize appropriate hypoglycemia management plan. Patient reports adherence with medication. Control is suboptimal due to dietary indiscretion and sedentary lifestyle.  Patient has a poor understanding of diabetes management. Focused on dietary changes and eating more meats and vegetables and less processed foods. He has been monitoring his fasting blood glucoses at home and he understands what those are. Will focus more on post-prandial readings at next visit - patient has poor health literacy and too much information would overwhelm him.  Continue metformin 1000 mg BID and glipizide 10 mg BID. Continue Levemir 50 units BID. [facility] and he scheduled an appointment for April.   Next A1C anticipated June 2017.    Written patient instructions provided.  Total time in face to face counseling 30 minutes.  Follow up in Pharmacist Clinic Visit in 2 weeks for further diabetes education  prior to endocrinology visit.

## 2015-05-19 NOTE — Patient Instructions (Signed)
Thanks for coming to see me!   Call Endocrinology at [phone] to schedule your appointment

## 2015-05-20 ENCOUNTER — Other Ambulatory Visit: Payer: Self-pay | Admitting: Internal Medicine

## 2015-05-20 MED FILL — !LEVEMIR FLEXPEN 100UNITS/M: 100U/ML (3) | 7 days supply | Qty: 9 | Fill #0

## 2015-05-22 NOTE — Progress Notes (Signed)
Thanks Stacy.

## 2015-06-02 ENCOUNTER — Ambulatory Visit: Payer: Self-pay | Attending: Internal Medicine | Admitting: Pharmacist

## 2015-06-02 DIAGNOSIS — E1140 Type 2 diabetes mellitus with diabetic neuropathy, unspecified: Secondary | ICD-10-CM | POA: Insufficient documentation

## 2015-06-02 DIAGNOSIS — Z7984 Long term (current) use of oral hypoglycemic drugs: Secondary | ICD-10-CM | POA: Insufficient documentation

## 2015-06-02 DIAGNOSIS — IMO0001 Reserved for inherently not codable concepts without codable children: Secondary | ICD-10-CM

## 2015-06-02 DIAGNOSIS — E1165 Type 2 diabetes mellitus with hyperglycemia: Secondary | ICD-10-CM | POA: Insufficient documentation

## 2015-06-02 DIAGNOSIS — Z794 Long term (current) use of insulin: Secondary | ICD-10-CM | POA: Insufficient documentation

## 2015-06-02 LAB — GLUCOSE, POCT (MANUAL RESULT ENTRY): POC GLUCOSE: 229 mg/dL — AB (ref 70–99)

## 2015-06-02 NOTE — Progress Notes (Signed)
S:    Patient arrives in good spirits.  Presents for diabetes evaluation, education, and management at the request of Holland CommonsValerie Keck. Patient was referred on 05/05/15.  Patient was last seen by Primary Care Provider on 05/05/15.   Patient reports adherence with medications.  Current diabetes medications include: metformin 1000 mg BID, glipizide 10 mg BID, Levemir 50 units BID  Patient denies hypoglycemic events.   Patient reports nocturia.  Patient reports neuropathy. Patient denies visual changes. Patient reports self foot exams.   Patient is very frustrated with his blood glucose readings. He has not seen anything less than 200 but also hasn't seen anything greater that 285.   O:  Lab Results  Component Value Date   HGBA1C 11.2 05/05/2015   There were no vitals filed for this visit.  Home fasting CBG: 200s, highest 285 per his report 2 hour post-prandial/random CBG: no readings  POCT glucose = 229  A/P: Diabetes longstanding currently uncontrolled based on A1c of 11.2. Patient denies hypoglycemic events and is able to verbalize appropriate hypoglycemia management plan. Patient reports adherence with medication. Control is suboptimal due to dietary indiscretion and sedentary lifestyle.  Patient has a poor understanding of diabetes management. Discussed monitoring post-prandial readings and the importance of those for determining further management. Patient will start to obtain post-prandials and will take his blood glucose meter and log book to the endocrinology appointment in 2 weeks.   Continue metformin 1000 mg BID and glipizide 10 mg BID. Continue Levemir 50 units BID. Will not make any changes this close to endocrinology appointment so that they can adjust as they see fit and patient won't have purchased more medications that could be changed at that visit.   Next A1C anticipated June 2017.    Written patient instructions provided.  Total time in face to face counseling 30  minutes.  Follow up with Endocrinology in 2 weeks as scheduled.

## 2015-06-02 NOTE — Patient Instructions (Addendum)
Thanks for coming to see me!  Follow up with the Endocrinology office in 2 weeks!   Blood Glucose Monitoring, Adult Monitoring your blood glucose (also know as blood sugar) helps you to manage your diabetes. It also helps you and your health care provider monitor your diabetes and determine how well your treatment plan is working. WHY SHOULD YOU MONITOR YOUR BLOOD GLUCOSE?  It can help you understand how food, exercise, and medicine affect your blood glucose.  It allows you to know what your blood glucose is at any given moment. You can quickly tell if you are having low blood glucose (hypoglycemia) or high blood glucose (hyperglycemia).  It can help you and your health care provider know how to adjust your medicines.  It can help you understand how to manage an illness or adjust medicine for exercise. WHEN SHOULD YOU TEST? Your health care provider will help you decide how often you should check your blood glucose. This may depend on the type of diabetes you have, your diabetes control, or the types of medicines you are taking. Be sure to write down all of your blood glucose readings so that this information can be reviewed with your health care provider. See below for examples of testing times that your health care provider may suggest. Type 1 Diabetes  Test at least 2 times per day if your diabetes is well controlled, if you are using an insulin pump, or if you perform multiple daily injections.  If your diabetes is not well controlled or if you are sick, you may need to test more often.  It is a good idea to also test:  Before every insulin injection.  Before and after exercise.  Between meals and 2 hours after a meal.  Occasionally between 2:00 a.m. and 3:00 a.m. Type 2 Diabetes  If you are taking insulin, test at least 2 times per day. However, it is best to test before every insulin injection.  If you take medicines by mouth (orally), test 2 times a day.  If you are on a  controlled diet, test once a day.  If your diabetes is not well controlled or if you are sick, you may need to monitor more often. HOW TO MONITOR YOUR BLOOD GLUCOSE Supplies Needed  Blood glucose meter.  Test strips for your meter. Each meter has its own strips. You must use the strips that go with your own meter.  A pricking needle (lancet).  A device that holds the lancet (lancing device).  A journal or log book to write down your results. Procedure  Wash your hands with soap and water. Alcohol is not preferred.  Prick the side of your finger (not the tip) with the lancet.  Gently milk the finger until a small drop of blood appears.  Follow the instructions that come with your meter for inserting the test strip, applying blood to the strip, and using your blood glucose meter. Other Areas to Get Blood for Testing Some meters allow you to use other areas of your body (other than your finger) to test your blood. These areas are called alternative sites. The most common alternative sites are:  The forearm.  The thigh.  The back area of the lower leg.  The palm of the hand. The blood flow in these areas is slower. Therefore, the blood glucose values you get may be delayed, and the numbers are different from what you would get from your fingers. Do not use alternative sites if you  think you are having hypoglycemia. Your reading will not be accurate. Always use a finger if you are having hypoglycemia. Also, if you cannot feel your lows (hypoglycemia unawareness), always use your fingers for your blood glucose checks. ADDITIONAL TIPS FOR GLUCOSE MONITORING  Do not reuse lancets.  Always carry your supplies with you.  All blood glucose meters have a 24-hour "hotline" number to call if you have questions or need help.  Adjust (calibrate) your blood glucose meter with a control solution after finishing a few boxes of strips. BLOOD GLUCOSE RECORD KEEPING It is a good idea to keep a  daily record or log of your blood glucose readings. Most glucose meters, if not all, keep your glucose records stored in the meter. Some meters come with the ability to download your records to your home computer. Keeping a record of your blood glucose readings is especially helpful if you are wanting to look for patterns. Make notes to go along with the blood glucose readings because you might forget what happened at that exact time. Keeping good records helps you and your health care provider to work together to achieve good diabetes management.    This information is not intended to replace advice given to you by your health care provider. Make sure you discuss any questions you have with your health care provider.   Document Released: 02/22/2003 Document Revised: 03/12/2014 Document Reviewed: 07/14/2012 Elsevier Interactive Patient Education 2016 [employer]  Basic Carbohydrate Counting for Diabetes Mellitus Carbohydrate counting is a method for keeping track of the amount of carbohydrates you eat. Eating carbohydrates naturally increases the level of sugar (glucose) in your blood, so it is important for you to know the amount that is okay for you to have in every meal. Carbohydrate counting helps keep the level of glucose in your blood within normal limits. The amount of carbohydrates allowed is different for every person. A dietitian can help you calculate the amount that is right for you. Once you know the amount of carbohydrates you can have, you can count the carbohydrates in the foods you want to eat. Carbohydrates are found in the following foods:  Grains, such as breads and cereals.  Dried beans and soy products.  Starchy vegetables, such as potatoes, peas, and corn.  Fruit and fruit juices.  Milk and yogurt.  Sweets and snack foods, such as cake, cookies, candy, chips, soft drinks, and fruit drinks. CARBOHYDRATE COUNTING There are two ways to count the carbohydrates in your  food. You can use either of the methods or a combination of both. Reading the "Nutrition Facts" on Packaged Food The "Nutrition Facts" is an area that is included on the labels of almost all packaged food and beverages in the [place]. It includes the serving size of that food or beverage and information about the nutrients in each serving of the food, including the grams (g) of carbohydrate per serving.  Decide the number of servings of this food or beverage that you will be able to eat or drink. Multiply that number of servings by the number of grams of carbohydrate that is listed on the label for that serving. The total will be the amount of carbohydrates you will be having when you eat or drink this food or beverage. Learning Standard Serving Sizes of Food When you eat food that is not packaged or does not include "Nutrition Facts" on the label, you need to measure the servings in order to count the amount of carbohydrates.A  serving of most carbohydrate-rich foods contains about 15 g of carbohydrates. The following list includes serving sizes of carbohydrate-rich foods that provide 15 g ofcarbohydrate per serving:   1 slice of bread (1 oz) or 1 six-inch tortilla.    of a hamburger bun or English muffin.  4-6 crackers.   cup unsweetened dry cereal.    cup hot cereal.   cup rice or pasta.    cup mashed potatoes or  of a large baked potato.  1 cup fresh fruit or one small piece of fruit.    cup canned or frozen fruit or fruit juice.  1 cup milk.   cup plain fat-free yogurt or yogurt sweetened with artificial sweeteners.   cup cooked dried beans or starchy vegetable, such as peas, corn, or potatoes.  Decide the number of standard-size servings that you will eat. Multiply that number of servings by 15 (the grams of carbohydrates in that serving). For example, if you eat 2 cups of strawberries, you will have eaten 2 servings and 30 g of carbohydrates (2 servings x 15 g  = 30 g). For foods such as soups and casseroles, in which more than one food is mixed in, you will need to count the carbohydrates in each food that is included. EXAMPLE OF CARBOHYDRATE COUNTING Sample Dinner  3 oz chicken breast.   cup of brown rice.   cup of corn.  1 cup milk.   1 cup strawberries with sugar-free whipped topping.  Carbohydrate Calculation Step 1: Identify the foods that contain carbohydrates:   Rice.   Corn.   Milk.   Strawberries. Step 2:Calculate the number of servings eaten of each:   2 servings of rice.   1 serving of corn.   1 serving of milk.   1 serving of strawberries. Step 3: Multiply each of those number of servings by 15 g:   2 servings of rice x 15 g = 30 g.   1 serving of corn x 15 g = 15 g.   1 serving of milk x 15 g = 15 g.   1 serving of strawberries x 15 g = 15 g. Step 4: Add together all of the amounts to find the total grams of carbohydrates eaten: 30 g + 15 g + 15 g + 15 g = 75 g.   This information is not intended to replace advice given to you by your health care provider. Make sure you discuss any questions you have with your health care provider.   Document Released: 02/19/2005 Document Revised: 03/12/2014 Document Reviewed: 01/16/2013 Elsevier Interactive Patient Education [employer]

## 2015-06-06 NOTE — Addendum Note (Signed)
Addended by: Juanita CraverKARL, Axel Frisk A on: 06/06/2015 03:55 PM   Modules accepted: Level of Service

## 2015-06-10 ENCOUNTER — Ambulatory Visit: Payer: Self-pay | Admitting: Endocrinology

## 2015-06-13 ENCOUNTER — Encounter: Payer: Self-pay | Admitting: Endocrinology

## 2015-06-13 ENCOUNTER — Other Ambulatory Visit: Payer: Self-pay | Admitting: *Deleted

## 2015-06-13 ENCOUNTER — Ambulatory Visit (INDEPENDENT_AMBULATORY_CARE_PROVIDER_SITE_OTHER): Payer: Self-pay | Admitting: Endocrinology

## 2015-06-13 VITALS — BP 150/80 | HR 87 | Temp 98.4°F | Resp 14 | Ht 71.0 in | Wt 349.4 lb

## 2015-06-13 DIAGNOSIS — E1165 Type 2 diabetes mellitus with hyperglycemia: Secondary | ICD-10-CM

## 2015-06-13 DIAGNOSIS — Z794 Long term (current) use of insulin: Secondary | ICD-10-CM

## 2015-06-13 LAB — BASIC METABOLIC PANEL
BUN: 18 mg/dL (ref 6–23)
CHLORIDE: 103 meq/L (ref 96–112)
CO2: 25 meq/L (ref 19–32)
Calcium: 9.6 mg/dL (ref 8.4–10.5)
Creatinine, Ser: 0.64 mg/dL (ref 0.40–1.50)
GFR: 138.01 mL/min (ref >60.00)
GLUCOSE: 218 mg/dL — AB (ref 70–99)
POTASSIUM: 4.1 meq/L (ref 3.5–5.1)
Sodium: 138 mEq/L (ref 135–145)

## 2015-06-13 MED ORDER — INSULIN NPH ISOPHANE & REGULAR (70-30) 100 UNIT/ML ~~LOC~~ SUSP: SUBCUTANEOUS | Status: DC

## 2015-06-13 MED ORDER — METFORMIN HCL ER 500 MG PO TB24: 1000.0000 mg | ORAL_TABLET | Freq: Every day | ORAL | Status: DC

## 2015-06-13 MED ORDER — CANAGLIFLOZIN 100 MG PO TABS: ORAL_TABLET | ORAL | Status: DC

## 2015-06-13 MED FILL — NOVOLIN 70/30 100 UNITS/ML: (70-30) 100 | 30 days supply | Qty: 20 | Fill #0

## 2015-06-13 MED FILL — TRUEplus LANCETS 28G MISC: 25 days supply | Qty: 100 | Fill #1

## 2015-06-13 MED FILL — TRUE METRIX TEST STRIP: 25 days supply | Qty: 100 | Fill #1

## 2015-06-13 MED FILL — ?BUPROPION HCL XL 150 MG TA: 150 | 30 days supply | Qty: 30 | Fill #0

## 2015-06-13 MED FILL — METFORMIN HCL ER 500 MG TAB: 500 | 30 days supply | Qty: 60 | Fill #0

## 2015-06-13 MED FILL — INVOKANA 100 MG TABLET: 100 | 30 days supply | Qty: 30 | Fill #0

## 2015-06-13 NOTE — Progress Notes (Signed)
Patient ID: Jason Hall, male   DOB: 1960/08/25, 55 y.o.   MRN: [id number]           Reason for Appointment: Consultation for Type 2 Diabetes  Referring [facility]  History of Present Illness:          Date of diagnosis of type 2 diabetes mellitus: 2000       Background history:   He is not clear what led to the diagnosis of diabetes  initially, was under the care of physicians out of town He may have been taking metformin initially and this has been continued Also at some point he was given glipizide He thinks he has been on insulin for about 5 years and various forms His blood sugars were relatively better controlled with 70/30 insulin in 2015 with his A1c 8.3 and 7.5  Recent history:   His blood sugars have been overall poorly controlled since 2016 and is now referred here for further management  INSULIN regimen is:  Levemir 50 units twice a day       Current management, blood sugar patterns and problems identified:  Although he has significant difficulty losing weight he thinks his maximum weight had been just over 400 previously  His blood sugars have been persistently poorly controlled since A1c has been about 11 consistently  He is not able to take his insulin and medication consistently because of financial difficulties and until recently was taking out of date insulin  He says his blood sugars tend to go up higher with meals but also usually persistently high in the mornings   He does not follow any meal plan  Currently not motivated to do any exercise     Non-insulin hypoglycemic drugs the patient is taking are: Metformin 1g bid      Side effects from medications have been: ?  Diarrhea from metformin  Compliance with the medical regimen: Irregular Hypoglycemia:    Glucose monitoring:  done 2 times a day         Glucometer: True metrix  Blood Glucose readings by recall:  PREMEAL Breakfast Lunch Dinner Bedtime  Overall   Glucose range: 180-270    341   Median:         Self-care: The diet that the patient has been following is: tries to limit High-fat foods .     Drinks lite juice, usually avoiding sweetened soft drinks or tea  Typical meal intake: Breakfast is Cereal/grits               Dietician visit, most recent: years ago               Exercise: minimal now  Weight history:  Wt Readings from Last 3 Encounters:  06/13/15 349 lb 6.4 oz (158.487 kg)  05/05/15 341 lb (154.677 kg)  02/18/15 351 lb 6.4 oz (159.394 kg)    Glycemic control:   Lab Results  Component Value Date   HGBA1C 11.2 05/05/2015   HGBA1C 10.90 02/18/2015   HGBA1C 11.40 05/17/2014   Lab Results  Component Value Date   MICROALBUR 10.4* 05/17/2014   LDLCALC NOT CALC 05/17/2014   CREATININE 0.64 06/13/2015   No results found for: MICRALBCREAT        Medication List       This list is accurate as of: 06/13/15  8:58 PM.  Always use your most recent med list.               acetaminophen 325 MG  tablet  Commonly known as:  TYLENOL  Take 2 tablets (650 mg total) by mouth every 6 (six) hours as needed for moderate pain.     amLODipine 10 MG tablet  Commonly known as:  NORVASC  Take 1 tablet (10 mg total) by mouth daily.     atorvastatin 40 MG tablet  Commonly known as:  LIPITOR  Take 1 tablet (40 mg total) by mouth daily.     busPIRone 10 MG tablet  Commonly known as:  BUSPAR  Take 20 mg by mouth 3 (three) times daily.     canagliflozin 100 MG Tabs tablet  Commonly known as:  INVOKANA  1 tablet before breakfast     glucose blood test strip  Commonly known as:  TRUE METRIX BLOOD GLUCOSE TEST  Use as instructed     hydrochlorothiazide 25 MG tablet  Commonly known as:  HYDRODIURIL  Take 1 tablet (25 mg total) by mouth daily.     insulin NPH-regular Human (70-30) 100 UNIT/ML injection  Commonly known as:  NOVOLIN 70/30  30 minutes before breakfast and dinner inject 35 units.     Insulin Pen Needle 32G X 4 MM Misc  Commonly  known as:  ULTICARE MICRO PEN NEEDLES  Give insulin twice per day for E11.9     INSULIN SYRINGE 1CC/28G 28G X 1/2" 1 ML Misc  Used as instructed by PCP     levothyroxine 75 MCG tablet  Commonly known as:  SYNTHROID, LEVOTHROID  Take 1 tablet (75 mcg total) by mouth daily before breakfast.     lisinopril 40 MG tablet  Commonly known as:  PRINIVIL,ZESTRIL  Take 1 tablet (40 mg total) by mouth daily.     metFORMIN 500 MG 24 hr tablet  Commonly known as:  GLUCOPHAGE-XR  Take 2 tablets (1,000 mg total) by mouth daily with supper.     omega-3 acid ethyl esters 1 g capsule  Commonly known as:  LOVAZA  Take 2 capsules (2 g total) by mouth 2 (two) times daily. To help lower triglycerides     sertraline 100 MG tablet  Commonly known as:  ZOLOFT  Take 100 mg by mouth 2 (two) times daily.     traZODone 100 MG tablet  Commonly known as:  DESYREL  Take 300 mg by mouth at bedtime.     TRUE METRIX METER w/Device Kit  USE AS INSTRUCTED     TRUEPLUS LANCETS 28G Misc  USE AS INSTRUCTED        Allergies:  Allergies  Allergen Reactions  . Shellfish Allergy Nausea And Vomiting    Past Medical History  Diagnosis Date  . COPD (chronic obstructive pulmonary disease) ([place])   . Diabetes mellitus without complication ([place])   . Hypertension   . 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 Leg surgery Right 2004    upper thigh from a spider bite    No family history on file.  Social History:  reports that he has been smoking Cigarettes.  He has never used smokeless tobacco. He reports that he does not drink alcohol or use illicit drugs.    Review of Systems    Lipid history: Has persistently high triglycerides, taking Lovaza.  also on Lipitor for hypercholesterolemia   Lab Results  Component Value Date   CHOL 264* 05/17/2014   HDL 33* 05/17/2014   LDLCALC NOT CALC 05/17/2014   TRIG  558* 05/17/2014    CHOLHDL 8.0 05/17/2014           Hypertension:Controlled with lisinopril 40 mg, amlodipine and HCTZ  BP Readings from Last 3 Encounters:  06/13/15 150/80  05/05/15 152/87  02/18/15 142/84   Most recent eye exam was 2/17 with Dr Mendel Ryder  Most recent foot exam: 3/17  Review of Systems  Constitutional: Negative for reduced appetite.  HENT: Negative for headaches and trouble swallowing.   Eyes: Negative for blurred vision.  Respiratory: Negative for shortness of breath.   Cardiovascular: Positive for leg swelling. Negative for chest pain.  Gastrointestinal: Positive for diarrhea.  Endocrine: Positive for fatigue and polydipsia.  Genitourinary: Positive for nocturia.  Musculoskeletal: Negative for joint pain.       Occasional knee joint pains  Neurological: Positive for balance difficulty. Negative for numbness and tingling.       Restless legs  Psychiatric/Behavioral: Positive for depressed mood.       Depression, long-standing, improved with Zoloft and Desyrel.  Also history of anxiety   Reportedly has had hypothyroidism, currently taking 75 g levothyroxine  Lab Results  Component Value Date   TSH 2.588 02/18/2015   TSH 5.725* 05/17/2014   FREET4 0.90 02/18/2015   FREET4 0.93 05/17/2014      LABS:  Office Visit on 06/13/2015  Component Date Value Ref Range Status  . Sodium 06/13/2015 138  135 - 145 mEq/L Final  . Potassium 06/13/2015 4.1  3.5 - 5.1 mEq/L Final  . Chloride 06/13/2015 103  96 - 112 mEq/L Final  . CO2 06/13/2015 25  19 - 32 mEq/L Final  . Glucose, Bld 06/13/2015 218* 70 - 99 mg/dL Final  . BUN 06/13/2015 18  6 - 23 mg/dL Final  . Creatinine, Ser 06/13/2015 0.64  0.40 - 1.50 mg/dL Final  . Calcium 06/13/2015 9.6  8.4 - 10.5 mg/dL Final  . GFR 06/13/2015 138.01  >60.00 mL/min Final    Physical Examination:  BP 150/80 mmHg  Pulse 87  Temp(Src) 98.4 F (36.9 C) (Oral)  Resp 14  Ht 5' 11"  (1.803 m)  Wt 349 lb 6.4 oz (158.487 kg)  BMI 48.75  kg/m2  SpO2 94%  GENERAL:         Patient has marked generalized obesity.   HEENT:         Eye exam shows normal external appearance. Fundus exam shows no retinopathy.  Oral exam shows normal mucosa .  NECK:   There is no lymphadenopathy Thyroid is not enlarged and no nodules felt.  Carotids are normal to palpation and no bruit heard LUNGS:         Chest is symmetrical. Lungs are clear to auscultation.Marland Kitchen   HEART:         Heart sounds:  S1 and S2 are normal. No murmur or click heard., no S3 or S4.   ABDOMEN:   There is no distention present. Liver and spleen are not palpable. No other mass or tenderness present.   NEUROLOGICAL:   Ankle jerks are absent bilaterally.    Diabetic Foot Exam - Simple   Simple Foot Form  Diabetic Foot exam was performed with the following findings:  Yes 06/13/2015  2:29 PM  Visual Inspection  No deformities, no ulcerations, no other skin breakdown bilaterally:  Yes  See comments:  Yes  Sensation Testing  Intact to touch and monofilament testing bilaterally:  Yes  Pulse Check  Posterior Tibialis and Dorsalis pulse intact bilaterally:  Yes  See  comments:  Yes  Comments  Marked onychomycosis of toenails Difficult to palpate posterior tibialis pulses because of mild edema             Vibration sense is Moderately reduced in distal first toes. MUSCULOSKELETAL:  There is no swelling or deformity of the peripheral joints. Spine is normal to inspection.   EXTREMITIES:     There is 1+ lower leg/pedal edema.  SKIN:    he has dry scaly slightly erythematous skin on the distal feet and plantar surfaces. Significant onychomycosis of toes present    ASSESSMENT:  Diabetes type 2, uncontrolled with A1c consistently around 11%    He has marked obesity with BMI 49 Currently not controlled with taking about 100 units of basal insulin, metformin and glipizide Unlikely to be responding to glipizide and is insulin deficient METFORMIN appears to be causing chronic diarrhea  also  Needs to be on basal bolus insulin regimen for control However because of his financial situation he may not be able to afford brand name basal insulin and mealtime insulin Also has questionable compliance with taking insulin He does need more intensive treatment along with medication that may help him with weight loss which is Invokana  Has an inadequate diabetes education  Complications: Appears to have some evidence of neuropathy on exam.  Unknown microalbumin/creatinine ratio.  Reportedly has no retinopathy  Hypertension, continues to have systolic increase in blood pressure with current 3 drug regimen   MIXED hyperlipidemia, uncontrolled.  Has increased triglycerides partly related to poor diabetes control and severe obesity  PLAN:     Switch Levemir to premixed insulin 35 units twice a day before meals.  If this is not able to control his blood sugars adequately or uniformly will consider NPH and Regular Insulin separately in multiple injections  He was given a blood sugar diary to keep her detailed record of his blood sugars at various times.  Discussed timing and targets of blood sugar testing  Recheck renal function today Discussed action of SGLT 2 drugs on lowering glucose by decreasing kidney absorption of glucose, benefits of weight loss and lower blood pressure, possible side effects including candidiasis and dosage regimen  Start Invokana 100 mg daily if his renal function is confirmed normal.  Not clear this will be available through his clinic, may need to apply for indigent program.  With starting this he will reduce his HCTZ to half tablet Stop glipizide He will change metformin to metformin ER 1000 mg daily.  This may help eliminate his diarrhea and will try to increase this gradually Increase walking for exercise Consultation with dietitian for meal planning Follow-up in 2 weeks for review  Patient Instructions  Check blood sugars on waking up 3-4  times a  week   Also check blood sugars about 2 hours after a meal and do this after different meals by rotation  Recommended blood sugar levels on waking up is 90-130 and about 2 hours after meal is 130-180  Please bring your blood sugar record to each visit, thank you  MEDICATION changes:  1. Stop glipizide and current metformin 2. Start metformin ER 500 mg, 2 tablets at dinnertime daily only 3. If able to get the new medication Invokana this will be taken one tablet in the morning, 100 mg.  This will help bring your glucose, blood pressure and fluid level down.  May need to increase fluid intake in the first 2-3 days with this.  Also did not take this  medication on the days you are unable to eat or drink because of illness or surgery ALSO with starting Invokana reduce the hydrochlorothiazide to half tablet 4. INSULIN:  Start 70/30 insulin, 35 units before breakfast and supper.  Try to take this 30 minutes before eating if able to  Start walking as much as tolerated DAILY      Jason Hall 06/13/2015, 8:58 PM   Note: This office note was prepared with Dragon voice recognition system technology. Any transcriptional errors that result from this process are unintentional.

## 2015-06-13 NOTE — Patient Instructions (Addendum)
Check blood sugars on waking up 3-4  times a week   Also check blood sugars about 2 hours after a meal and do this after different meals by rotation  Recommended blood sugar levels on waking up is 90-130 and about 2 hours after meal is 130-180  Please bring your blood sugar record to each visit, thank you  MEDICATION changes:  1. Stop glipizide and current metformin 2. Start metformin ER 500 mg, 2 tablets at dinnertime daily only 3. If able to get the new medication Invokana this will be taken one tablet in the morning, 100 mg.  This will help bring your glucose, blood pressure and fluid level down.  May need to increase fluid intake in the first 2-3 days with this.  Also did not take this medication on the days you are unable to eat or drink because of illness or surgery ALSO with starting Invokana reduce the hydrochlorothiazide to half tablet 4. INSULIN:  Start 70/30 insulin, 35 units before breakfast and supper.  Try to take this 30 minutes before eating if able to  Start walking as much as tolerated DAILY

## 2015-06-15 MED FILL — ?HYDROCHLOROTHIAZIDE 25 MG: 25 MG | 30 days supply | Qty: 30 | Fill #2
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
## 2015-07-05 ENCOUNTER — Ambulatory Visit: Payer: Self-pay | Admitting: Endocrinology

## 2015-07-05 DIAGNOSIS — Z0289 Encounter for other administrative examinations: Secondary | ICD-10-CM

## 2015-07-06 ENCOUNTER — Ambulatory Visit: Payer: Self-pay | Admitting: Endocrinology

## 2015-07-12 MED FILL — OMEGA-3 ETHYL ESTERS 1 GM C: 1 | 15 days supply | Qty: 60 | Fill #2

## 2015-07-12 MED FILL — LISINOPRIL 40 MG TABLET: 40 | 30 days supply | Qty: 30 | Fill #2

## 2015-07-15 MED FILL — NOVOLIN 70/30 100 UNITS/ML: (70-30) 100 | 30 days supply | Qty: 20 | Fill #1

## 2015-07-15 MED FILL — ?HYDROCHLOROTHIAZIDE 25 MG: 25 MG | 30 days supply | Qty: 30 | Fill #3

## 2015-07-15 MED FILL — METFORMIN HCL ER 500 MG TAB: 500 | 30 days supply | Qty: 60 | Fill #1

## 2015-07-18 ENCOUNTER — Other Ambulatory Visit: Payer: Self-pay | Admitting: *Deleted

## 2015-07-18 MED ORDER — DAPAGLIFLOZIN PROPANEDIOL 5 MG PO TABS: 5.0000 mg | ORAL_TABLET | Freq: Every day | ORAL | Status: DC

## 2015-07-18 MED FILL — FARXIGA 5 MG TABLET: 5 | 30 days supply | Qty: 30 | Fill #0

## 2015-07-19 MED FILL — ?SERTRALINE HCL 100 MG TAB: 100 | 30 days supply | Qty: 60 | Fill #0

## 2015-07-19 MED FILL — ?BUPROPION HCL XL 150 MG TA: 150 | 30 days supply | Qty: 30 | Fill #0

## 2015-07-19 MED FILL — busPIRone HCL 10 MG TABS: 10 | 30 days supply | Qty: 180 | Fill #0

## 2015-07-19 MED FILL — traZODone HCL 100 MG TABS: 100 | 30 days supply | Qty: 30 | Fill #0

## 2015-07-21 ENCOUNTER — Encounter: Payer: Self-pay | Admitting: Endocrinology

## 2015-07-21 ENCOUNTER — Ambulatory Visit (INDEPENDENT_AMBULATORY_CARE_PROVIDER_SITE_OTHER): Payer: Self-pay | Admitting: Endocrinology

## 2015-07-21 VITALS — BP 138/84 | HR 90 | Wt 343.0 lb

## 2015-07-21 DIAGNOSIS — Z794 Long term (current) use of insulin: Secondary | ICD-10-CM

## 2015-07-21 DIAGNOSIS — E1165 Type 2 diabetes mellitus with hyperglycemia: Secondary | ICD-10-CM

## 2015-07-21 LAB — BASIC METABOLIC PANEL
BUN: 12 mg/dL (ref 6–23)
CALCIUM: 9.5 mg/dL (ref 8.4–10.5)
CO2: 26 meq/L (ref 19–32)
CREATININE: 0.67 mg/dL (ref 0.40–1.50)
Chloride: 100 mEq/L (ref 96–112)
GFR: 130.86 mL/min (ref >60.00)
GLUCOSE: 322 mg/dL — AB (ref 70–99)
Potassium: 3.6 mEq/L (ref 3.5–5.1)
Sodium: 136 mEq/L (ref 135–145)

## 2015-07-21 LAB — GLUCOSE, POCT (MANUAL RESULT ENTRY): POC Glucose: 321 mg/dl — AB (ref 70–99)

## 2015-07-21 MED ORDER — CANAGLIFLOZIN 300 MG PO TABS: 300.0000 mg | ORAL_TABLET | Freq: Every day | ORAL | Status: DC

## 2015-07-21 NOTE — Patient Instructions (Addendum)
Check blood sugars on waking up 3-4  times a week Also check blood sugars about 2 hours after a meal and do this after different meals by rotation  Recommended blood sugar levels on waking up is 90-130 and about 2 hours after meal is 130-160  Please bring your blood sugar monitor to each visit, thank you  Increase INSULIN to 65 units before Bfst and supper  Avoid juices and buttermilk over 4 oz.  Invokana 300mg  in am

## 2015-07-21 NOTE — Progress Notes (Signed)
Patient ID: Jason Hall, male   DOB: [birth year], 55 y.o.   MRN: [id number]           Reason for Appointment:  Follow-up for Type 2 Diabetes  Referring physician:Valerie Feliciana Rossetti  History of Present Illness:          Date of diagnosis of type 2 diabetes mellitus: 2000       Background history:   He is not clear what led to the diagnosis of diabetes  initially, was under the care of physicians out of town He may have been taking metformin initially and this has been continued Also at some point he was given glipizide He thinks he has been on insulin for about 5 years and various forms His blood sugars were relatively better controlled with 70/30 insulin in 2015 with his A1c 8.3 and 7.5  Recent history:   His blood sugars have been overall poorly controlled since 2016  INSULIN regimen is:  Novolin 70/30, 50 units twice a day       Current management, blood sugar patterns and problems identified:  On his initial consultation in 4/17 he was changed from Levemir to 70/30 insulin twice a day as well as started on Invokana 100 mg and metformin was changed to metformin ER 1000 mg; glipizide.  He is still using a Generic monitor as he does not have insurance coverage and checking blood sugars chronically and keeping a record and her diet without the date and time entered  Blood sugars are ranging from 209-286 recently but today in the afternoon it is over 300  He has difficulty complying with instructions or understanding his directions; he was told to start with 35 units of insulin but he is taking 50 units on his own as well as taking the second dose erratically, sometimes at bedtime and today before lunch  He has tolerated his new regimen and has lost weight with starting Invokana   He does not follow any meal plan and has not been seen by dietitian yet  Currently not motivated to do any exercise     Non-insulin hypoglycemic drugs the patient is taking are: Metformin ER 2000 mg  daily, Invokana 100 mg daily Side effects from medications have been: ?  Diarrhea from metformin  Compliance with the medical regimen: Irregular  Glucose monitoring:  done 0- 2 times a day         Glucometer: True metrix  Blood Glucose readings by recall: As above   Self-care: The diet that the patient has been following is: tries to limit High-fat foods .     Drinks lite juice, usually avoiding sweetened soft drinks or tea  Typical meal intake: Breakfast is Cereal/grits               Dietician visit, most recent: years ago               Exercise: minimal   Weight history:  Wt Readings from Last 3 Encounters:  07/21/15 343 lb (155.584 kg)  06/13/15 349 lb 6.4 oz (158.487 kg)  05/05/15 341 lb (154.677 kg)    Glycemic control:   Lab Results  Component Value Date   HGBA1C 11.2 05/05/2015   HGBA1C 10.90 02/18/2015   HGBA1C 11.40 05/17/2014   Lab Results  Component Value Date   MICROALBUR 10.4* 05/17/2014   LDLCALC NOT CALC 05/17/2014   CREATININE 0.67 07/21/2015   No results found for: [facility]        Medication List  This list is accurate as of: 07/21/15  9:05 PM.  Always use your most recent med list.               acetaminophen 325 MG tablet  Commonly known as:  TYLENOL  Take 2 tablets (650 mg total) by mouth every 6 (six) hours as needed for moderate pain.     amLODipine 10 MG tablet  Commonly known as:  NORVASC  Take 1 tablet (10 mg total) by mouth daily.     atorvastatin 40 MG tablet  Commonly known as:  LIPITOR  Take 1 tablet (40 mg total) by mouth daily.     busPIRone 10 MG tablet  Commonly known as:  BUSPAR  Take 20 mg by mouth 3 (three) times daily.     canagliflozin 300 MG Tabs tablet  Commonly known as:  INVOKANA  Take 1 tablet (300 mg total) by mouth daily before breakfast.     dapagliflozin propanediol 5 MG Tabs tablet  Commonly known as:  FARXIGA  Take 5 mg by mouth daily.     glucose blood test strip  Commonly known  as:  TRUE METRIX BLOOD GLUCOSE TEST  Use as instructed     hydrochlorothiazide 25 MG tablet  Commonly known as:  HYDRODIURIL  Take 1 tablet (25 mg total) by mouth daily.     insulin NPH-regular Human (70-30) 100 UNIT/ML injection  Commonly known as:  NOVOLIN 70/30  30 minutes before breakfast and dinner inject 35 units.     Insulin Pen Needle 32G X 4 MM Misc  Commonly known as:  ULTICARE MICRO PEN NEEDLES  Give insulin twice per day for E11.9     INSULIN SYRINGE 1CC/28G 28G X 1/2" 1 ML Misc  Used as instructed by PCP     levothyroxine 75 MCG tablet  Commonly known as:  SYNTHROID, LEVOTHROID  Take 1 tablet (75 mcg total) by mouth daily before breakfast.     lisinopril 40 MG tablet  Commonly known as:  PRINIVIL,ZESTRIL  Take 1 tablet (40 mg total) by mouth daily.     metFORMIN 500 MG 24 hr tablet  Commonly known as:  GLUCOPHAGE-XR  Take 2 tablets (1,000 mg total) by mouth daily with supper.     omega-3 acid ethyl esters 1 g capsule  Commonly known as:  LOVAZA  Take 2 capsules (2 g total) by mouth 2 (two) times daily. To help lower triglycerides     sertraline 100 MG tablet  Commonly known as:  ZOLOFT  Take 100 mg by mouth 2 (two) times daily.     traZODone 100 MG tablet  Commonly known as:  DESYREL  Take 300 mg by mouth at bedtime.     TRUE METRIX METER w/Device Kit  USE AS INSTRUCTED     TRUEPLUS LANCETS 28G Misc  USE AS INSTRUCTED        Allergies:  Allergies  Allergen Reactions  . Shellfish Allergy Nausea And Vomiting    Past Medical History  Diagnosis Date  . COPD (chronic obstructive pulmonary disease) ([place])   . Diabetes mellitus without complication ([place])   . Hypertension   . 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 Leg surgery Right 2004    upper thigh from a spider bite    No family history on file.  Social History:  reports that he has been smoking Cigarettes.   He has  never used smokeless tobacco. He reports that he does not drink alcohol or use illicit drugs.    Review of Systems    Lipid history: Has persistently high triglycerides, taking Lovaza.  also on Lipitor for hypercholesterolemia   Lab Results  Component Value Date   CHOL 264* 05/17/2014   HDL 33* 05/17/2014   LDLCALC NOT CALC 05/17/2014   TRIG 558* 05/17/2014   CHOLHDL 8.0 05/17/2014           Hypertension:Controlled with lisinopril 40 mg, amlodipine and HCTZ  BP Readings from Last 3 Encounters:  07/21/15 138/84  06/13/15 150/80  05/05/15 152/87   Most recent eye exam was 2/17 with Dr Mendel Ryder  Most recent foot exam: 3/17  Review of Systems  Reportedly has had hypothyroidism, currently taking 75 g levothyroxine  Lab Results  Component Value Date   TSH 2.588 02/18/2015   TSH 5.725* 05/17/2014   FREET4 0.90 02/18/2015   FREET4 0.93 05/17/2014      LABS:  Office Visit on 07/21/2015  Component Date Value Ref Range Status  . Sodium 07/21/2015 136  135 - 145 mEq/L Final  . Potassium 07/21/2015 3.6  3.5 - 5.1 mEq/L Final  . Chloride 07/21/2015 100  96 - 112 mEq/L Final  . CO2 07/21/2015 26  19 - 32 mEq/L Final  . Glucose, Bld 07/21/2015 322* 70 - 99 mg/dL Final  . BUN 07/21/2015 12  6 - 23 mg/dL Final  . Creatinine, Ser 07/21/2015 0.67  0.40 - 1.50 mg/dL Final  . Calcium 07/21/2015 9.5  8.4 - 10.5 mg/dL Final  . GFR 07/21/2015 130.86  >60.00 mL/min Final  . POC Glucose 07/21/2015 321* 70 - 99 mg/dl Final    Physical Examination:  BP 138/84 mmHg  Pulse 90  Wt 343 lb (155.584 kg)  SpO2 93%    ASSESSMENT:  Diabetes type 2, uncontrolled with A1c consistently around 11%    He has marked obesity with BMI 49  With switching to premixed insulin as well as adding Invokana his blood sugars are not appearing to be much better most readings over 200 and today over 300 in the office He appears to be significantly insulin resistant Needs significant amount of  diabetes education. He has not taken his 70/30 insulin consistently at suppertime and may be taking it at bedtime as well as randomly during the day Weight is slightly better with starting Invokana  PLAN:   Written instructions given and explained to him on his printout Will increase his insulin by 15 units a ID Emphasized the need to take insulin right before breakfast and supper Increase Invokana to 300 mg Consistent glucose monitoring and discussed when to check his blood sugar. He will keep a record and not diary that was given and bring it to the office visit Diabetes education Emphasized need to avoid high carbohydrate and high sugar meals and drinks Regular walking  Patient Instructions  Check blood sugars on waking up 3-4  times a week Also check blood sugars about 2 hours after a meal and do this after different meals by rotation  Recommended blood sugar levels on waking up is 90-130 and about 2 hours after meal is 130-160  Please bring your blood sugar monitor to each visit, thank you  Increase INSULIN to 65 units before Bfst and supper  Avoid juices and buttermilk over 4 oz.  Invokana 345m in am     Counseling time on subjects discussed above is over 50% of today's 25  minute visit   Jeferson Boozer 07/21/2015, 9:05 PM   Note: This office note was prepared with Estate agent. Any transcriptional errors that result from this process are unintentional.

## 2015-07-22 ENCOUNTER — Telehealth: Payer: Self-pay | Admitting: Endocrinology

## 2015-07-22 NOTE — Telephone Encounter (Signed)
The pharmacy called to verify a prescription that was sent in, CB # [phone]

## 2015-07-25 NOTE — Telephone Encounter (Signed)
[facility]jal from [facility] calling to clarify Prescriptions do patient take Theodis Satonvokana or Marcelline DeistFarxiga, please advise [phone]

## 2015-07-25 NOTE — Telephone Encounter (Signed)
The prescription clearly said "to replace Invokana".  I did call back and left a message informing them that it is suppose to be 5 mg Farxiga, 1 tablet daily.

## 2015-08-02 ENCOUNTER — Ambulatory Visit: Payer: Self-pay | Attending: Internal Medicine

## 2015-08-04 ENCOUNTER — Encounter: Payer: Self-pay | Admitting: Dietician

## 2015-08-11 MED FILL — METFORMIN HCL ER 500 MG TAB: 500 | 30 days supply | Qty: 60 | Fill #2

## 2015-08-18 ENCOUNTER — Other Ambulatory Visit (INDEPENDENT_AMBULATORY_CARE_PROVIDER_SITE_OTHER): Payer: Self-pay

## 2015-08-18 DIAGNOSIS — E1165 Type 2 diabetes mellitus with hyperglycemia: Secondary | ICD-10-CM

## 2015-08-18 DIAGNOSIS — Z794 Long term (current) use of insulin: Secondary | ICD-10-CM

## 2015-08-18 LAB — COMPREHENSIVE METABOLIC PANEL
ALT: 35 U/L (ref 0–53)
AST: 32 U/L (ref 0–37)
Albumin: 4.3 g/dL (ref 3.5–5.2)
Alkaline Phosphatase: 79 U/L (ref 39–117)
BILIRUBIN TOTAL: 0.4 mg/dL (ref 0.2–1.2)
BUN: 15 mg/dL (ref 6–23)
CHLORIDE: 98 meq/L (ref 96–112)
CO2: 28 meq/L (ref 19–32)
CREATININE: 0.81 mg/dL (ref 0.40–1.50)
Calcium: 9.8 mg/dL (ref 8.4–10.5)
GFR: 105.09 mL/min (ref >60.00)
GLUCOSE: 340 mg/dL — AB (ref 70–99)
Potassium: 3.8 mEq/L (ref 3.5–5.1)
SODIUM: 136 meq/L (ref 135–145)
Total Protein: 7.8 g/dL (ref 6.0–8.3)

## 2015-08-18 LAB — HEMOGLOBIN A1C: HEMOGLOBIN A1C: 10.5 % — AB (ref 4.6–6.5)

## 2015-08-19 ENCOUNTER — Encounter: Payer: No Typology Code available for payment source | Attending: Internal Medicine | Admitting: Dietician

## 2015-08-19 ENCOUNTER — Encounter: Payer: Self-pay | Admitting: Dietician

## 2015-08-19 VITALS — Ht 69.0 in | Wt 328.0 lb

## 2015-08-19 DIAGNOSIS — Z029 Encounter for administrative examinations, unspecified: Secondary | ICD-10-CM | POA: Insufficient documentation

## 2015-08-19 DIAGNOSIS — IMO0002 Reserved for concepts with insufficient information to code with codable children: Secondary | ICD-10-CM

## 2015-08-19 DIAGNOSIS — Z794 Long term (current) use of insulin: Secondary | ICD-10-CM

## 2015-08-19 DIAGNOSIS — E1165 Type 2 diabetes mellitus with hyperglycemia: Secondary | ICD-10-CM

## 2015-08-19 DIAGNOSIS — E118 Type 2 diabetes mellitus with unspecified complications: Secondary | ICD-10-CM

## 2015-08-19 NOTE — Progress Notes (Signed)
Medical Nutrition Therapy:  Appt start time: 1300 end time:  1400.   Assessment:  Primary concerns today: Patient is here alone.  He would like more information on the long term effects of uncontrolled diabetes.  He has had type 2 diabetes for >16 years and has been on insulin for about 6 years.  Irregular sleep schedule.  Follows Adkins diet irregularly Depending on his food [place].  He checks his blood sugar onece daily (usually in the am) and the lowest he has seen this week is 241.  Patient states that he is taking 70/30 insulin (Novolin) with breakfast and dinner but "ca't read the tiny numbers on the syringe so is unsure how much he is taking.  His HgbA1C was 10.5% 08/18/15 and has been staying around 11 recently.  He states that he was using expired insulin and that caused his blood sugar to be uncontrolled.  He has lost 20 lbs in the past 2 months.  Weight hx: Today 328 lbs States that he has lost weight due to decreased money for food.  He states that he eats lighter but always has a meal. 06/13/15:  349 lbs.   Highest adult weight 403 lbs 30 years ago Lowest adult weight current weight is the lowest he remembers.  Patient lives with his disabled mother and cares for her.  Patient does the shopping and cooking.  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  He states that his mother is picky about what she will eat.  He gets take out about 3 times per week.  He reports that he was premature and that he has vision problems in his right eye.  Preferred Learning Style:   No preference indicated   Learning Readiness:   Ready  MEDICATIONS: Novolin and Metformin.  He states that he is no longer taking the Invokana and is waiting for the 20th of this month to get the [place] filled and has not started this.   DIETARY INTAKE:  24-hr recall:  B (11 AM): leftovers from dinner (2 egg rolls and cheese wontons), coffee with sugar free chocolate syrup, or  grits or cereal (cheerios or raisin bran crunch or shredded wheat)  and LF milk OR egg, country ham sandwich. Snk ( AM): orange  L ( PM): sometimes none Snk ( PM): none D (6:30 PM): Sausage biscuit OR chinese OR cooks meat and starch (baked or fried potatoes) Snk ( PM): none Beverages: water, coffee with sugar free chocolate syrup, sweet tea or unsweetened tea, diet soda twice per day, regular soda occasionally  Usual physical activity: Walks 3 times per week for 15 minutes.  Estimated energy needs: 2000 calories 225 g carbohydrates 125 g protein 67 g fat  Progress Towards Goal(s):  In progress.   Nutritional Diagnosis:  NB-1.1 Food and nutrition-related knowledge deficit As related to balance of carbohydrate, protein, and fat.  As evidenced by diet hx and patient report.    Intervention:  Nutrition counseling/education related to basic diabetic diet appropriate for weight loss.  Showed carbohydrates portion size.  Discussed the benefit of exercise.  Discussed the risks of uncontrolled blood sugar.  Teach back method used and he was able to verbalize.  Great job on the weight loss.   Stay as active as possible.  Walk for 30 minutes most days. Avoid drinking beverages with sugar. Limit juice to 1/2 cup occasionally.  Aim for 3 Carb Choices per meal (45 grams) +/-  1 either way  Aim for 0-15 Carbs per snack if hungry  Include protein in moderation with your meals and snacks Consider reading food labels for Total Carbohydrate and Fat Grams of foods Consider checking BG at alternate times per day as directed by MD  Consider taking medication as directed by MD  Teaching Method Utilized:  Visual Auditory Hands on  Handouts given during visit include:  Meal plan card  Snack list  My plate  Barriers to learning/adherence to lifestyle change: ability to understand  Demonstrated degree of understanding via:  Teach Back   Monitoring/Evaluation:  Dietary intake, exercise, label  reading, and body weight prn.

## 2015-08-19 NOTE — Patient Instructions (Signed)
Great job on the weight loss.   Stay as active as possible.  Walk for 30 minutes most days. Avoid drinking beverages with sugar. Limit juice to 1/2 cup occasionally.  Aim for 3 Carb Choices per meal (45 grams) +/- 1 either way  Aim for 0-15 Carbs per snack if hungry  Include protein in moderation with your meals and snacks Consider reading food labels for Total Carbohydrate and Fat Grams of foods Consider checking BG at alternate times per day as directed by MD  Consider taking medication as directed by MD

## 2015-08-22 ENCOUNTER — Encounter: Payer: Self-pay | Admitting: Endocrinology

## 2015-08-22 ENCOUNTER — Ambulatory Visit (INDEPENDENT_AMBULATORY_CARE_PROVIDER_SITE_OTHER): Payer: Self-pay | Admitting: Endocrinology

## 2015-08-22 VITALS — BP 143/84 | HR 94 | Ht 71.0 in | Wt 331.0 lb

## 2015-08-22 DIAGNOSIS — E1165 Type 2 diabetes mellitus with hyperglycemia: Secondary | ICD-10-CM

## 2015-08-22 DIAGNOSIS — Z794 Long term (current) use of insulin: Secondary | ICD-10-CM

## 2015-08-22 NOTE — Progress Notes (Signed)
Patient ID: Jason Hall, male   DOB: 07/29/1960, 55 y.o.   MRN: [id number]           Reason for Appointment:  Follow-up for Type 2 Diabetes  Referring physician:Valerie Keck  History of Present Illness:          Date of diagnosis of type 2 diabetes mellitus: 2000       Background history:   He is not clear what led to the diagnosis of diabetes  initially, was under the care of physicians out of town He may have been taking metformin initially and this has been continued Also at some point he was given glipizide He thinks he has been on insulin for about 5 years and various forms His blood sugars were relatively better controlled with 70/30 insulin in 2015 with his A1c 8.3 and 7.5 On his initial consultation in 4/17 he was changed from Levemir to 70/30 insulin twice a day as well as started on Invokana 100 mg and metformin was changed to metformin ER 1000 mg; glipizide was stopped  Recent history:   His blood sugars have been overall poorly controlled since 2016  His A1c now is 10.5, not really better than before  INSULIN regimen is:  Novolin 70/30, 50 units twice a day       Current management, blood sugar patterns and problems identified:  He is still using a Generic monitor as he does not have insurance coverage   He is checking his blood sugars relatively at various times and recently has not kept any record  Blood sugars are checked at all different times in the afternoons and evenings and not clear which readings are before meals.  He thinks he is checking readings mostly before his afternoon/evening meal  He has difficulty complying with instructions or understanding his directions; he was told to increase his insulin dose to 65 units twice a day but he now says that he cannot see the markings on his syringes and not clear what he is doing  He was shown an insulin syringe today and he appears to be taking 50 units twice a day again  He also is not have his Invokana.   He thinks that he was given a prescription package by the pharmacy without any medication and he does not think he can get another prescription until at least tomorrow  Although he has lost weight his blood sugars appear to be somewhat higher  He does not follow any meal plan  His diet was assessed by the nutritionist and appears to be significantly high in carbohydrates, simple sugars and high-fat items  Currently not motivated to do any exercise     Non-insulin hypoglycemic drugs the patient is taking are: Metformin ER 2000 mg daily, Invokana 100 mg daily Side effects from medications have been: ?  Diarrhea from metformin  Compliance with the medical regimen: Irregular  Glucose monitoring:  done 0- 2 times a day         Glucometer: True metrix  Blood Glucose readings by review of home monitor :  A.m. readings around 9-11 AM = 291-351 P.m. readings between 1 PM-11:15 PM = 243-374 The meter has incorrect date programmed   Self-care: The diet that the patient has been following is: tries to limit High-fat foods .     Drinks lite juice, usually avoiding sweetened soft drinks or tea  Typical meal intake: Breakfast is Cereal/grits                 Dietician visit, most recent: years ago               Exercise: minimal   Weight history:  Wt Readings from Last 3 Encounters:  08/22/15 331 lb (150.141 kg)  08/19/15 328 lb (148.78 kg)  07/21/15 343 lb (155.584 kg)    Glycemic control:   Lab Results  Component Value Date   HGBA1C 10.5* 08/18/2015   HGBA1C 11.2 05/05/2015   HGBA1C 10.90 02/18/2015   Lab Results  Component Value Date   MICROALBUR 10.4* 05/17/2014   LDLCALC NOT CALC 05/17/2014   CREATININE 0.81 08/18/2015   No results found for: MICRALBCREAT        Medication List       This list is accurate as of: 08/22/15  5:01 PM.  Always use your most recent med list.               acetaminophen 325 MG tablet  Commonly known as:  TYLENOL  Take 2 tablets (650 mg  total) by mouth every 6 (six) hours as needed for moderate pain.     amLODipine 10 MG tablet  Commonly known as:  NORVASC  Take 1 tablet (10 mg total) by mouth daily.     atorvastatin 40 MG tablet  Commonly known as:  LIPITOR  Take 1 tablet (40 mg total) by mouth daily.     busPIRone 10 MG tablet  Commonly known as:  BUSPAR  Take 20 mg by mouth 3 (three) times daily.     canagliflozin 300 MG Tabs tablet  Commonly known as:  INVOKANA  Take 1 tablet (300 mg total) by mouth daily before breakfast.     glucose blood test strip  Commonly known as:  TRUE METRIX BLOOD GLUCOSE TEST  Use as instructed     hydrochlorothiazide 25 MG tablet  Commonly known as:  HYDRODIURIL  Take 1 tablet (25 mg total) by mouth daily.     insulin NPH-regular Human (70-30) 100 UNIT/ML injection  Commonly known as:  NOVOLIN 70/30  30 minutes before breakfast and dinner inject 35 units.     Insulin Pen Needle 32G X 4 MM Misc  Commonly known as:  ULTICARE MICRO PEN NEEDLES  Give insulin twice per day for E11.9     INSULIN SYRINGE 1CC/28G 28G X 1/2" 1 ML Misc  Used as instructed by PCP     levothyroxine 75 MCG tablet  Commonly known as:  SYNTHROID, LEVOTHROID  Take 1 tablet (75 mcg total) by mouth daily before breakfast.     lisinopril 40 MG tablet  Commonly known as:  PRINIVIL,ZESTRIL  Take 1 tablet (40 mg total) by mouth daily.     metFORMIN 500 MG 24 hr tablet  Commonly known as:  GLUCOPHAGE-XR  Take 2 tablets (1,000 mg total) by mouth daily with supper.     multivitamin with minerals Tabs tablet  Take 1 tablet by mouth daily.     omega-3 acid ethyl esters 1 g capsule  Commonly known as:  LOVAZA  Take 2 capsules (2 g total) by mouth 2 (two) times daily. To help lower triglycerides     sertraline 100 MG tablet  Commonly known as:  ZOLOFT  Take 100 mg by mouth 2 (two) times daily.     traZODone 100 MG tablet  Commonly known as:  DESYREL  Take 300 mg by mouth at bedtime.     TRUE METRIX  METER w/Device Kit  USE AS INSTRUCTED  TRUEPLUS LANCETS 28G Misc  USE AS INSTRUCTED        Allergies:  Allergies  Allergen Reactions  . Shellfish Allergy Nausea And Vomiting    Past Medical History  Diagnosis Date  . COPD (chronic obstructive pulmonary disease) (HCC)   . Diabetes mellitus without complication (HCC)   . Hypertension   . 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 Leg surgery Right 2004    upper thigh from a spider bite    No family history on file.  Social History:  reports that he has been smoking Cigarettes.  He has never used smokeless tobacco. He reports that he does not drink alcohol or use illicit drugs.    Review of Systems    Lipid history: Has persistently high triglycerides, taking Lovaza.  also on Lipitor for hypercholesterolemia Followed by PCP   Lab Results  Component Value Date   CHOL 264* 05/17/2014   HDL 33* 05/17/2014   LDLCALC NOT CALC 05/17/2014   TRIG 558* 05/17/2014   CHOLHDL 8.0 05/17/2014           Hypertension:Controlled with lisinopril 40 mg, amlodipine and HCTZ  BP Readings from Last 3 Encounters:  08/22/15 143/84  07/21/15 138/84  06/13/15 150/80   Most recent eye exam was 2/17 with Dr Lindsey  Most recent foot exam: 3/17  Review of Systems  Reportedly has had hypothyroidism, currently taking 75 g levothyroxine  Lab Results  Component Value Date   TSH 2.588 02/18/2015   TSH 5.725* 05/17/2014   FREET4 0.90 02/18/2015   FREET4 0.93 05/17/2014      LABS:  Lab on 08/18/2015  Component Date Value Ref Range Status  . Hgb A1c MFr Bld 08/18/2015 10.5* 4.6 - 6.5 % Final   Glycemic Control Guidelines for People with Diabetes:Non Diabetic:  <6%Goal of Therapy: <7%Additional Action Suggested:  >8%   . Sodium 08/18/2015 136  135 - 145 mEq/L Final  . Potassium 08/18/2015 3.8  3.5 - 5.1 mEq/L Final  . Chloride 08/18/2015 98  96 - 112  mEq/L Final  . CO2 08/18/2015 28  19 - 32 mEq/L Final  . Glucose, Bld 08/18/2015 340* 70 - 99 mg/dL Final  . BUN 08/18/2015 15  6 - 23 mg/dL Final  . Creatinine, Ser 08/18/2015 0.81  0.40 - 1.50 mg/dL Final  . Total Bilirubin 08/18/2015 0.4  0.2 - 1.2 mg/dL Final  . Alkaline Phosphatase 08/18/2015 79  39 - 117 U/L Final  . AST 08/18/2015 32  0 - 37 U/L Final  . ALT 08/18/2015 35  0 - 53 U/L Final  . Total Protein 08/18/2015 7.8  6.0 - 8.3 g/dL Final  . Albumin 08/18/2015 4.3  3.5 - 5.2 g/dL Final  . Calcium 08/18/2015 9.8  8.4 - 10.5 mg/dL Final  . GFR 08/18/2015 105.09  >60.00 mL/min Final    Physical Examination:  BP 143/84 mmHg  Pulse 94  Ht 5' 11" (1.803 m)  Wt 331 lb (150.141 kg)  BMI 46.19 kg/m2    ASSESSMENT:  Diabetes type 2, uncontrolled with A1c consistently around 11%   See history of present illness for detailed discussion of current diabetes management, blood sugar patterns and problems identified   Although blood sugars appear to be somewhat better with adding Invokana and increasing insulin he is now not taking Invokana as he was unable to get it from his pharmacy for some   reason His blood sugars are probably higher and A1c has not improved As discussed about difficult to assess his home blood sugars as he is using a Generic monitor and not clear which readings are post prandial He still is taking his insulin and eating at arbitrary times As discussed with dietitian he appears to have poor diet and not clear if he can improve this  PLAN:   Written instructions given for all changes He will make sure he has a 1 mL insulin syringe and a 70 units twice a Refill Invokana Improve diet as discussed with dietitian He was given a blood sugar diary to keep a record of his glucose readings and insulin He will try to check some readings at bedtime also  Patient Instructions  Check blood sugars on waking up 4-5  times a week  Also check blood sugars either before  supper or about 2 hours after any meal and do this after different meals by rotation  Recommended blood sugar levels on waking up is 90-130 and about 2 hours after meal is 130-160  Please bring your blood sugar diary to each visit, thank you   70 units INSULIN 2x daily    Counseling time on subjects discussed above is over 50% of today's 25 minute visit   KUMAR,AJAY 08/22/2015, 5:01 PM   Note: This office note was prepared with Dragon voice recognition system technology. Any transcriptional errors that result from this process are unintentional.  

## 2015-08-22 NOTE — Patient Instructions (Addendum)
Check blood sugars on waking up 4-5  times a week  Also check blood sugars either before supper or about 2 hours after any meal and do this after different meals by rotation  Recommended blood sugar levels on waking up is 90-130 and about 2 hours after meal is 130-160  Please bring your blood sugar diary to each visit, thank you   70 units INSULIN 2x daily

## 2015-08-26 MED FILL — ?BUPROPION HCL XL 150 MG TA: 150 | 30 days supply | Qty: 30 | Fill #1

## 2015-08-26 MED FILL — ?SERTRALINE HCL 100 MG TAB: 100 | 30 days supply | Qty: 60 | Fill #1

## 2015-08-26 MED FILL — !NOVOLIN 70/30 100 UNITS/ML: (70-30) 100 | 30 days supply | Qty: 20 | Fill #2

## 2015-08-26 MED FILL — TRUE METRIX TEST STRIP: 25 days supply | Qty: 100 | Fill #2

## 2015-08-26 MED FILL — OMEGA-3 ETHYL ESTERS 1 GM C: 1 | 15 days supply | Qty: 60 | Fill #3

## 2015-08-26 MED FILL — HYDROCHLOROTHIAZIDE 25 MG T: 25 | 30 days supply | Qty: 30 | Fill #4

## 2015-08-26 MED FILL — busPIRone HCL 10 MG TABS: 10 | 30 days supply | Qty: 180 | Fill #1

## 2015-08-26 MED FILL — TRUEplus LANCETS 28G MISC: 25 days supply | Qty: 100 | Fill #2

## 2015-08-26 MED FILL — traZODone HCL 100 MG TABS: 100 | 30 days supply | Qty: 30 | Fill #1

## 2015-08-26 MED FILL — LISINOPRIL 40 MG TABLET: 40 | 30 days supply | Qty: 30 | Fill #3

## 2015-09-09 MED FILL — FARXIGA 5 MG TABLET: 5 | 30 days supply | Qty: 30 | Fill #1

## 2015-09-13 ENCOUNTER — Encounter: Payer: No Typology Code available for payment source | Attending: Internal Medicine | Admitting: Nutrition

## 2015-09-13 VITALS — Wt 331.4 lb

## 2015-09-13 DIAGNOSIS — Z794 Long term (current) use of insulin: Secondary | ICD-10-CM

## 2015-09-13 DIAGNOSIS — E1165 Type 2 diabetes mellitus with hyperglycemia: Secondary | ICD-10-CM

## 2015-09-13 DIAGNOSIS — Z029 Encounter for administrative examinations, unspecified: Secondary | ICD-10-CM | POA: Insufficient documentation

## 2015-09-13 NOTE — Progress Notes (Signed)
Patient reports taking 70u BID, but not taking his oral meds.  He says the pharmacy is having trouble getting [place].   I gave him my card and told him to go by the pharmacy today and give them my card, if the medication is not available.  He agreed to do this. He is using a [facility] meter.  Testing only once a day. FBSs 225-330, with one reading 430 in the last 2 weeks.   He reports that he is watching his diet, and trying to limit sweets.  He is not drinking sweet drinks,or juices, but having cold cereal and milk in the mornings for breakfast 5-6 days/wk.  He was asked to test his blood sugar 2 hours after eating the cereal, and if over 250, he needs to switch to cheese toast or peanut butter toast.

## 2015-09-13 NOTE — Patient Instructions (Signed)
Test blood sugar 2hr. After eating cereal.  If blood sugar is over 220, stop eating cereal, and switch to cheese toast, or peanut butter toast. Have pharmacy give me a call if not able to pick up the farxiga today.

## 2015-09-20 ENCOUNTER — Other Ambulatory Visit: Payer: Self-pay | Admitting: *Deleted

## 2015-09-20 DIAGNOSIS — E785 Hyperlipidemia, unspecified: Secondary | ICD-10-CM

## 2015-09-20 MED ORDER — OMEGA-3-ACID ETHYL ESTERS 1 G PO CAPS: 2.0000 g | ORAL_CAPSULE | Freq: Two times a day (BID) | ORAL | Status: AC

## 2015-09-20 MED ORDER — OMEGA-3-ACID ETHYL ESTERS 1 G PO CAPS: 2.0000 g | ORAL_CAPSULE | Freq: Two times a day (BID) | ORAL | Status: DC

## 2015-09-20 MED ORDER — CANAGLIFLOZIN 300 MG PO TABS: 300.0000 mg | ORAL_TABLET | Freq: Every day | ORAL | Status: DC

## 2015-09-20 MED ORDER — INSULIN NPH ISOPHANE & REGULAR (70-30) 100 UNIT/ML ~~LOC~~ SUSP: SUBCUTANEOUS | Status: DC

## 2015-09-20 NOTE — Telephone Encounter (Signed)
PASS PROGRAM 

## 2015-09-30 ENCOUNTER — Other Ambulatory Visit (INDEPENDENT_AMBULATORY_CARE_PROVIDER_SITE_OTHER): Payer: Self-pay

## 2015-09-30 DIAGNOSIS — Z794 Long term (current) use of insulin: Secondary | ICD-10-CM

## 2015-09-30 DIAGNOSIS — E1165 Type 2 diabetes mellitus with hyperglycemia: Secondary | ICD-10-CM

## 2015-09-30 LAB — BASIC METABOLIC PANEL
BUN: 13 mg/dL (ref 6–23)
CO2: 28 mEq/L (ref 19–32)
Calcium: 9.5 mg/dL (ref 8.4–10.5)
Chloride: 100 mEq/L (ref 96–112)
Creatinine, Ser: 0.65 mg/dL (ref 0.40–1.50)
GFR: 135.42 mL/min (ref >60.00)
GLUCOSE: 287 mg/dL — AB (ref 70–99)
POTASSIUM: 3.9 meq/L (ref 3.5–5.1)
SODIUM: 136 meq/L (ref 135–145)

## 2015-10-03 LAB — FRUCTOSAMINE: Fructosamine: 363 umol/L — ABNORMAL HIGH (ref 190–270)

## 2015-10-05 ENCOUNTER — Encounter: Payer: Self-pay | Admitting: Endocrinology

## 2015-10-05 ENCOUNTER — Ambulatory Visit (INDEPENDENT_AMBULATORY_CARE_PROVIDER_SITE_OTHER): Payer: Self-pay | Admitting: Endocrinology

## 2015-10-05 VITALS — BP 144/84 | HR 97 | Ht 71.0 in | Wt 322.0 lb

## 2015-10-05 DIAGNOSIS — Z794 Long term (current) use of insulin: Secondary | ICD-10-CM

## 2015-10-05 DIAGNOSIS — E1165 Type 2 diabetes mellitus with hyperglycemia: Secondary | ICD-10-CM

## 2015-10-05 DIAGNOSIS — E038 Other specified hypothyroidism: Secondary | ICD-10-CM

## 2015-10-05 DIAGNOSIS — E063 Autoimmune thyroiditis: Secondary | ICD-10-CM

## 2015-10-05 MED ORDER — DAPAGLIFLOZIN PROPANEDIOL 10 MG PO TABS: 10.0000 mg | ORAL_TABLET | Freq: Every day | ORAL | 3 refills | Status: DC

## 2015-10-05 MED FILL — FARXIGA 10 MG TABLET: 10 | 30 days supply | Qty: 30 | Fill #0

## 2015-10-05 NOTE — Patient Instructions (Signed)
Insulin 75 units before meals twice daily  Farxiga 5mg  2 pills daily in am  Check blood sugars on waking up  3x per week  Also check blood sugars about 2 hours after a meal and do this after different meals by rotation  Recommended blood sugar levels on waking up is 90-130 and about 2 hours after meal is 130-160  Please bring your blood sugar monitor to each visit, thank you

## 2015-10-05 NOTE — Progress Notes (Signed)
Patient ID: Jason Hall, male   DOB: [birth year], 55 y.o.   MRN: [id number]           Reason for Appointment:  Follow-up for Type 2 Diabetes  Referring physician:Valerie Feliciana Rossetti  History of Present Illness:          Date of diagnosis of type 2 diabetes mellitus: 2000       Background history:   Jason Hall is not clear what led to the diagnosis of diabetes  initially, was under the care of physicians out of town Jason Hall may have been taking metformin initially and this has been continued Also at some point Jason Hall was given glipizide Jason Hall thinks Jason Hall has been on insulin for about 5 years and various forms His blood sugars were relatively better controlled with 70/30 insulin in 2015 with his A1c 8.3 and 7.5 On his initial consultation in 4/17 Jason Hall was changed from Levemir to 70/30 insulin twice a day as well as started on Invokana 100 mg and metformin was changed to metformin ER 1000 mg; glipizide was stopped  Recent history:   His blood sugars have been overall poorly controlled since 2016  His A1c was 10.5 on his last visit and his insulin was increased   INSULIN regimen is:  Novolin 70/30, 70 units twice a day       Current management, blood sugar patterns and problems identified:  Jason Hall is still using a Generic monitor as Jason Hall does not have insurance coverage   Jason Hall is checking his blood sugars mostly in the morning hours and only occasionally later in the day  Blood sugars are fairly consistently 200 or more and only better in the last couple of days  Jason Hall does not keep a record of his sugars and difficulty processes overall blood sugar patterns  Jason Hall says Jason Hall has not been able to get his metformin and is taking only once a day but has been back on Farxiga, not clear why Jason Hall was not able to get Invokana  Jason Hall has lost weight from starting this  Although Jason Hall has lost weight his blood sugars appear to be somewhat higher  Jason Hall does not follow any meal plan but is trying to reduce drinks like sweet tea His diet  was assessed by the nutritionist and appears to be significantly high in carbohydrates, simple sugars and high-fat items  Currently not motivated to do any exercise     Non-insulin hypoglycemic drugs the patient is taking are: Metformin ER 2000 mg daily, Invokana 100 mg daily Side effects from medications have been: ?  Diarrhea from metformin  Compliance with the medical regimen: Irregular  Glucose monitoring:  done 0- 2 times a day         Glucometer: True metrix  Blood Glucose readings by review of home monitor :  Recent 14 day average Av 268  A.m. readings around 9-11 AM = 250-324, later in the day 215-268   Self-care: The diet that the patient has been following is: tries to limit High-fat foods .     Drinks lite juice, usually avoiding sweetened soft drinks or tea  Typical meal intake: Breakfast is Cereal/grits               Dietician visit, most recent: 6/17               Exercise: minimal   Weight history:  Wt Readings from Last 3 Encounters:  10/05/15 (!) 322 lb (146.1 kg)  09/13/15 (!) 331 lb 6.4 oz (  150.3 kg)  08/22/15 (!) 331 lb (150.1 kg)    Glycemic control:   Lab Results  Component Value Date   HGBA1C 10.5 (H) 08/18/2015   HGBA1C 11.2 05/05/2015   HGBA1C 10.90 02/18/2015   Lab Results  Component Value Date   MICROALBUR 10.4 (H) 05/17/2014   LDLCALC NOT CALC 05/17/2014   CREATININE 0.65 09/30/2015   No results found for: MICRALBCREAT   Lab on 09/30/2015  Component Date Value Ref Range Status  . Sodium 09/30/2015 136  135 - 145 mEq/L Final  . Potassium 09/30/2015 3.9  3.5 - 5.1 mEq/L Final  . Chloride 09/30/2015 100  96 - 112 mEq/L Final  . CO2 09/30/2015 28  19 - 32 mEq/L Final  . Glucose, Bld 09/30/2015 287* 70 - 99 mg/dL Final  . BUN 09/30/2015 13  6 - 23 mg/dL Final  . Creatinine, Ser 09/30/2015 0.65  0.40 - 1.50 mg/dL Final  . Calcium 09/30/2015 9.5  8.4 - 10.5 mg/dL Final  . GFR 09/30/2015 135.42  >60.00 mL/min Final  . Fructosamine  10/03/2015 363* 190 - 270 umol/L Final        Medication List       Accurate as of 10/05/15 11:59 PM. Always use your most recent med list.          acetaminophen 325 MG tablet Commonly known as:  TYLENOL Take 2 tablets (650 mg total) by mouth every 6 (six) hours as needed for moderate pain.   amLODipine 10 MG tablet Commonly known as:  NORVASC Take 1 tablet (10 mg total) by mouth daily.   atorvastatin 40 MG tablet Commonly known as:  LIPITOR Take 1 tablet (40 mg total) by mouth daily.   busPIRone 10 MG tablet Commonly known as:  BUSPAR Take 20 mg by mouth 3 (three) times daily.   dapagliflozin propanediol 10 MG Tabs tablet Commonly known as:  FARXIGA Take 10 mg by mouth daily.   glucose blood test strip Commonly known as:  TRUE METRIX BLOOD GLUCOSE TEST Use as instructed   hydrochlorothiazide 25 MG tablet Commonly known as:  HYDRODIURIL Take 1 tablet (25 mg total) by mouth daily.   insulin NPH-regular Human (70-30) 100 UNIT/ML injection Commonly known as:  NOVOLIN 70/30 30 minutes before breakfast and dinner inject 35 units.   Insulin Pen Needle 32G X 4 MM Misc Commonly known as:  ULTICARE MICRO PEN NEEDLES Give insulin twice per day for E11.9   INSULIN SYRINGE 1CC/28G 28G X 1/2" 1 ML Misc Used as instructed by PCP   levothyroxine 75 MCG tablet Commonly known as:  SYNTHROID, LEVOTHROID Take 1 tablet (75 mcg total) by mouth daily before breakfast.   lisinopril 40 MG tablet Commonly known as:  PRINIVIL,ZESTRIL Take 1 tablet (40 mg total) by mouth daily.   metFORMIN 500 MG 24 hr tablet Commonly known as:  GLUCOPHAGE-XR Take 2 tablets (1,000 mg total) by mouth daily with supper.   multivitamin with minerals Tabs tablet Take 1 tablet by mouth daily.   omega-3 acid ethyl esters 1 g capsule Commonly known as:  LOVAZA Take 2 capsules (2 g total) by mouth 2 (two) times daily. To help lower triglycerides   sertraline 100 MG tablet Commonly known as:   ZOLOFT Take 100 mg by mouth 2 (two) times daily.   traZODone 100 MG tablet Commonly known as:  DESYREL Take 300 mg by mouth at bedtime.   TRUE METRIX METER w/Device Kit USE AS INSTRUCTED   TRUEPLUS LANCETS 28G  Misc USE AS INSTRUCTED       Allergies:  Allergies  Allergen Reactions  . Shellfish Allergy Nausea And Vomiting    Past Medical History:  Diagnosis Date  . 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 COPD (chronic obstructive pulmonary disease) ([place])   . Diabetes mellitus without complication ([place])   . Hypertension     Past Surgical History:  Procedure Laterality Date  . LEG SURGERY Right 2004   upper thigh from a spider bite    No family history on file.  Social History:  reports that Jason Hall has been smoking Cigarettes.  Jason Hall has never used smokeless tobacco. Jason Hall reports that Jason Hall does not drink alcohol or use drugs.    Review of Systems    Lipid history: Has persistently high triglycerides, taking Lovaza.  also on Lipitor for hypercholesterolemia Followed by PCP   Lab Results  Component Value Date   CHOL 264 (H) 05/17/2014   HDL 33 (L) 05/17/2014   LDLCALC NOT CALC 05/17/2014   TRIG 558 (H) 05/17/2014   CHOLHDL 8.0 05/17/2014           Hypertension: Blood pressure is only fairly well controlled with lisinopril 40 mg, amlodipine and HCTZ  BP Readings from Last 3 Encounters:  10/05/15 (!) 144/84  08/22/15 (!) 143/84  07/21/15 138/84   Most recent eye exam was 2/17 with Dr Mendel Ryder  Most recent foot exam: 3/17  Review of Systems  Reportedly has had hypothyroidism, currently taking 75 g levothyroxine, Has not had any recent follow-up  Lab Results  Component Value Date   TSH 2.588 02/18/2015   TSH 5.725 (H) 05/17/2014   FREET4 0.90 02/18/2015   FREET4 0.93 05/17/2014      LABS:  Lab on 09/30/2015  Component Date Value Ref Range Status  . Sodium 09/30/2015 136  135 - 145 mEq/L Final  . Potassium 09/30/2015 3.9  3.5 -  5.1 mEq/L Final  . Chloride 09/30/2015 100  96 - 112 mEq/L Final  . CO2 09/30/2015 28  19 - 32 mEq/L Final  . Glucose, Bld 09/30/2015 287* 70 - 99 mg/dL Final  . BUN 09/30/2015 13  6 - 23 mg/dL Final  . Creatinine, Ser 09/30/2015 0.65  0.40 - 1.50 mg/dL Final  . Calcium 09/30/2015 9.5  8.4 - 10.5 mg/dL Final  . GFR 09/30/2015 135.42  >60.00 mL/min Final  . Fructosamine 10/03/2015 363* 190 - 270 umol/L Final    Physical Examination:  BP (!) 144/84 (BP Location: Left Arm, Patient Position: Sitting, Cuff Size: Normal)   Pulse 97   Ht 5' 11"  (1.803 m)   Wt (!) 322 lb (146.1 kg)   SpO2 96%   BMI 44.91 kg/m     ASSESSMENT:  Diabetes type 2, uncontrolled with A1c consistently around 11%   See history of present illness for detailed discussion of current diabetes management, blood sugar patterns and problems identified   Jason Hall is now on Farxiga and blood sugars are slightly better along with increasing his insulin Fructosamine of 363 still indicates overall high readings Jason Hall is still quite insulin resistant Not monitoring blood sugars consistently and can also improve his diet  PLAN:   Increase insulin to 75 units Increase Farxiga to 10 mg Jason Hall was given a blood sugar diary to keep a record of his glucose readings  Jason Hall will try to check some readings at bedtime also  Patient Instructions  Insulin 75 units before meals twice daily  Farxiga 52m 2 pills daily in am  Check blood sugars on waking up  3x per week  Also check blood sugars about 2 hours after a meal and do this after different meals by rotation  Recommended blood sugar levels on waking up is 90-130 and about 2 hours after meal is 130-160  Please bring your blood sugar monitor to each visit, thank you    Counseling time on subjects discussed above is over 50% of today's 25 minute visit   Marg Macmaster 10/06/2015, 7:45 AM   Note: This office note was prepared with Dragon voice recognition system technology. Any  transcriptional errors that result from this process are unintentional.

## 2015-10-06 ENCOUNTER — Other Ambulatory Visit: Payer: Self-pay | Admitting: Internal Medicine

## 2015-10-06 MED FILL — METFORMIN HCL ER 500 MG TAB: 500 | 30 days supply | Qty: 60 | Fill #3

## 2015-10-07 MED FILL — ?LEVOTHYROXINE 75 MCG TABLE: 75 | 30 days supply | Qty: 30 | Fill #0

## 2015-11-03 ENCOUNTER — Other Ambulatory Visit: Payer: Self-pay | Admitting: Endocrinology

## 2015-11-03 MED FILL — FARXIGA 10 MG TABLET: 10 | 30 days supply | Qty: 30 | Fill #1

## 2015-11-04 MED FILL — METFORMIN HCL ER 500 MG TAB: 500 | 30 days supply | Qty: 60 | Fill #0

## 2015-11-08 ENCOUNTER — Other Ambulatory Visit: Payer: Self-pay | Admitting: Endocrinology

## 2015-11-08 MED FILL — ?SERTRALINE HCL 100 MG TAB: 100 | 30 days supply | Qty: 60 | Fill #0

## 2015-11-08 MED FILL — TRUE METRIX TEST STRIP: 25 days supply | Qty: 100 | Fill #3

## 2015-11-08 MED FILL — $Lovaza 1gm capsule: 1 | 45 days supply | Qty: 180 | Fill #0

## 2015-11-08 MED FILL — ?BUPROPION HCL XL 150 MG TA: 150 | 30 days supply | Qty: 30 | Fill #0

## 2015-11-08 MED FILL — ?TRAZODONE 100 MG TABLET: 100 | 30 days supply | Qty: 30 | Fill #0

## 2015-11-08 MED FILL — NOVOLIN 70/30 100 UNITS/ML: (70-30) 100 | 30 days supply | Qty: 20 | Fill #0

## 2015-11-08 MED FILL — !LEVEMIR FLEXPEN 100UNITS/M: 100U/ML (3) | 7 days supply | Qty: 9 | Fill #1

## 2015-11-08 MED FILL — busPIRone HCL 10 MG TABS: 10 | 30 days supply | Qty: 180 | Fill #0

## 2015-11-08 MED FILL — TRUEplus LANCETS 28G MISC: 25 days supply | Qty: 100 | Fill #3

## 2015-11-11 ENCOUNTER — Other Ambulatory Visit (INDEPENDENT_AMBULATORY_CARE_PROVIDER_SITE_OTHER): Payer: Self-pay

## 2015-11-11 DIAGNOSIS — E1165 Type 2 diabetes mellitus with hyperglycemia: Secondary | ICD-10-CM

## 2015-11-11 DIAGNOSIS — Z794 Long term (current) use of insulin: Secondary | ICD-10-CM

## 2015-11-11 DIAGNOSIS — E038 Other specified hypothyroidism: Secondary | ICD-10-CM

## 2015-11-11 DIAGNOSIS — E063 Autoimmune thyroiditis: Secondary | ICD-10-CM

## 2015-11-11 LAB — HEMOGLOBIN A1C: Hgb A1c MFr Bld: 10.8 % — ABNORMAL HIGH (ref 4.6–6.5)

## 2015-11-11 LAB — TSH: TSH: 3.4 u[IU]/mL (ref 0.35–4.50)

## 2015-11-16 ENCOUNTER — Encounter: Payer: Self-pay | Admitting: Endocrinology

## 2015-11-16 ENCOUNTER — Ambulatory Visit (INDEPENDENT_AMBULATORY_CARE_PROVIDER_SITE_OTHER): Payer: Self-pay | Admitting: Endocrinology

## 2015-11-16 VITALS — BP 176/98 | HR 93 | Ht 71.0 in | Wt 320.0 lb

## 2015-11-16 DIAGNOSIS — Z794 Long term (current) use of insulin: Secondary | ICD-10-CM

## 2015-11-16 DIAGNOSIS — E1165 Type 2 diabetes mellitus with hyperglycemia: Secondary | ICD-10-CM

## 2015-11-16 NOTE — Progress Notes (Signed)
Patient ID: Jason Hall, male   DOB: 1960-08-06, 55 y.o.   MRN: [id number]           Reason for Appointment:  Follow-up for Type 2 Diabetes  Referring physician:Valerie Feliciana Rossetti  History of Present Illness:          Date of diagnosis of type 2 diabetes mellitus: 2000       Background history:   He is not clear what led to the diagnosis of diabetes  initially, was under the care of physicians out of town He may have been taking metformin initially and this has been continued Also at some point he was given glipizide He thinks he has been on insulin for about 5 years and various forms His blood sugars were relatively better controlled with 70/30 insulin in 2015 with his A1c 8.3 and 7.5 On his initial consultation in 4/17 he was changed from Levemir to 70/30 insulin twice a day as well as started on Invokana 100 mg and metformin was changed to metformin ER 1000 mg; glipizide was stopped  Recent history:   His blood sugars have been overall poorly controlled since 2016  His A1c was 10.8 last week  INSULIN regimen is:  Novolin 70/30, 65 units twice a day      Non-insulin hypoglycemic drugs the patient is taking are: Metformin ER 2000 mg daily, Farxiga 10 mg daily  Current management, blood sugar patterns and problems identified:  He is still using a Generic monitor as he does not have insurance coverage   He has not brought any record of his blood sugars and also did not bring his monitor.  He appears to be somewhat confused about what insulin he is taking and he claims that he started taking Levemir last night but there is no prescription for this  He was supposed to take 75 units of the premixed insulin twice a day but he is taking only 65.  His A1c has gone up slightly despite increasing his insulin doses on each visit.  He has reportedly high readings both morning and at night but not clear how often he checks blood sugars in the evenings.  He has had variable compliance with  diet and has not lost any weight  Currently not motivated to do any exercise      Side effects from medications have been: ?  Diarrhea from metformin  Compliance with the medical regimen: Irregular  Glucose monitoring:  done 0- 2 times a day         Glucometer: True metrix  Blood Glucose readings by recall  A.m. readings around 9-11 AM = 185-275, pc supper 300   Self-care: The diet that the patient has been following is: tries to limit High-fat foods .     Drinks lite juice, usually avoiding sweetened soft drinks or tea  Typical meal intake: Breakfast is Cereal/grits               Dietician visit, most recent: 6/17               Exercise: minimal   Weight history:  Wt Readings from Last 3 Encounters:  11/16/15 (!) 320 lb (145.2 kg)  10/05/15 (!) 322 lb (146.1 kg)  09/13/15 (!) 331 lb 6.4 oz (150.3 kg)    Glycemic control:   Lab Results  Component Value Date   HGBA1C 10.8 (H) 11/11/2015   HGBA1C 10.5 (H) 08/18/2015   HGBA1C 11.2 05/05/2015   Lab Results  Component Value Date  MICROALBUR 10.4 (H) 05/17/2014   LDLCALC NOT CALC 05/17/2014   CREATININE 0.65 09/30/2015   No results found for: MICRALBCREAT   Lab on 11/11/2015  Component Date Value Ref Range Status  . Hgb A1c MFr Bld 11/11/2015 10.8* 4.6 - 6.5 % Final  . TSH 11/11/2015 3.40  0.35 - 4.50 uIU/mL Final        Medication List       Accurate as of 11/16/15  9:50 PM. Always use your most recent med list.          acetaminophen 325 MG tablet Commonly known as:  TYLENOL Take 2 tablets (650 mg total) by mouth every 6 (six) hours as needed for moderate pain.   amLODipine 10 MG tablet Commonly known as:  NORVASC Take 1 tablet (10 mg total) by mouth daily.   atorvastatin 40 MG tablet Commonly known as:  LIPITOR Take 1 tablet (40 mg total) by mouth daily.   busPIRone 10 MG tablet Commonly known as:  BUSPAR Take 20 mg by mouth 3 (three) times daily.   dapagliflozin propanediol 10 MG Tabs  tablet Commonly known as:  FARXIGA Take 10 mg by mouth daily.   glucose blood test strip Commonly known as:  TRUE METRIX BLOOD GLUCOSE TEST Use as instructed   hydrochlorothiazide 25 MG tablet Commonly known as:  HYDRODIURIL Take 1 tablet (25 mg total) by mouth daily.   insulin NPH-regular Human (70-30) 100 UNIT/ML injection Commonly known as:  NOVOLIN 70/30 30 minutes before breakfast and dinner inject 35 units.   NOVOLIN 70/30 (70-30) 100 UNIT/ML injection Generic drug:  insulin NPH-regular Human INJECT 35 UNITS UNDER THE SKIN 30 MINUTES BEFORE BREAKFAST AND DINNER   Insulin Pen Needle 32G X 4 MM Misc Commonly known as:  ULTICARE MICRO PEN NEEDLES Give insulin twice per day for E11.9   INSULIN SYRINGE 1CC/28G 28G X 1/2" 1 ML Misc Used as instructed by PCP   levothyroxine 75 MCG tablet Commonly known as:  SYNTHROID, LEVOTHROID TAKE 1 TABLET BY MOUTH DAILY BEFORE BREAKFAST.   lisinopril 40 MG tablet Commonly known as:  PRINIVIL,ZESTRIL Take 1 tablet (40 mg total) by mouth daily.   metFORMIN 500 MG 24 hr tablet Commonly known as:  GLUCOPHAGE-XR TAKE 2 TABLETS BY MOUTH DAILY WITH SUPPER.   multivitamin with minerals Tabs tablet Take 1 tablet by mouth daily.   omega-3 acid ethyl esters 1 g capsule Commonly known as:  LOVAZA Take 2 capsules (2 g total) by mouth 2 (two) times daily. To help lower triglycerides   sertraline 100 MG tablet Commonly known as:  ZOLOFT Take 100 mg by mouth 2 (two) times daily.   traZODone 100 MG tablet Commonly known as:  DESYREL Take 300 mg by mouth at bedtime.   TRUE METRIX METER w/Device Kit USE AS INSTRUCTED   TRUEPLUS LANCETS 28G Misc USE AS INSTRUCTED       Allergies:  Allergies  Allergen Reactions  . Shellfish Allergy Nausea And Vomiting    Past Medical History:  Diagnosis Date  . 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 COPD (chronic obstructive pulmonary disease) ([place])   . Diabetes mellitus  without complication ([place])   . Hypertension     Past Surgical History:  Procedure Laterality Date  . LEG SURGERY Right 2004   upper thigh from a spider bite    No family history on file.  Social History:  reports that he has been smoking Cigarettes.  He has  never used smokeless tobacco. He reports that he does not drink alcohol or use drugs.    Review of Systems    Lipid history: Has persistently high triglycerides, taking Lovaza.  also on Lipitor for hypercholesterolemia Followed by PCP   Lab Results  Component Value Date   CHOL 264 (H) 05/17/2014   HDL 33 (L) 05/17/2014   LDLCALC NOT CALC 05/17/2014   TRIG 558 (H) 05/17/2014   CHOLHDL 8.0 05/17/2014           Hypertension: Blood pressure is not well controlled with lisinopril 40 mg, amlodipine and HCTZ, followed by PCP  BP Readings from Last 3 Encounters:  11/16/15 (!) 176/98  10/05/15 (!) 144/84  08/22/15 (!) 143/84   Most recent eye exam was 2/17 with Dr Jason Hall  Most recent foot exam: 3/17  Review of Systems  Reportedly has had hypothyroidism, currently taking 75 g levothyroxine, Has not had any recent follow-up  Lab Results  Component Value Date   TSH 3.40 11/11/2015   TSH 2.588 02/18/2015   TSH 5.725 (H) 05/17/2014   FREET4 0.90 02/18/2015   FREET4 0.93 05/17/2014      LABS:  Lab on 11/11/2015  Component Date Value Ref Range Status  . Hgb A1c MFr Bld 11/11/2015 10.8* 4.6 - 6.5 % Final  . TSH 11/11/2015 3.40  0.35 - 4.50 uIU/mL Final    Physical Examination:  BP (!) 176/98   Pulse 93   Ht _0  (1.803 m)   Wt (!) 320 lb (145.2 kg)   SpO2 97%   BMI 44.63 kg/m     ASSESSMENT:  Diabetes type 2, uncontrolled with A1c consistently around 11%   See history of present illness for detailed discussion of current diabetes management, blood sugar patterns and problems identified   His blood sugars are continuing to be poorly controlled. A1c is still over 10% He is still appearing to be  quite insulin resistant. Not clear if he is benefiting much from [place] which was increased on the last visit. He is not bringing any blood sugar record are his monitor for review even though he was given a diary in the last visit. He is also taking less insulin than prescribed on the last visit and may confuse large doses. He claims he is taking Levemir with him prescription is present on his record, not clear if he is seeing her PCP not on the system. He has not lost any weight  PLAN:   Increase insulin to 70--85 Better diet He was given a blood sugar diary to keep a record of his glucose readings  He will try to check some readings at bedtime also Follow-up with PCP for hypertension  Patient Instructions  Take only 70/30 insulin before meals   Am 70 units and before supper 85 units  Farxiga 64m in am  Check blood sugars on waking up  3x weekly  Also check blood sugars about 2 hours after a meal and do this after different meals by rotation  Recommended blood sugar levels on waking up is 90-130 and about 2 hours after meal is 130-160  Please bring your blood sugar monitor to each visit, thank you      [facility]9/13/2017, 9:50 PM   Note: This office note was prepared with Dragon voice recognition system technology. Any transcriptional errors that result from this process are unintentional.

## 2015-11-16 NOTE — Patient Instructions (Addendum)
Take only 70/30 insulin before meals   Am 70 units and before supper 85 units  Farxiga 10mg  in am  Check blood sugars on waking up  3x weekly  Also check blood sugars about 2 hours after a meal and do this after different meals by rotation  Recommended blood sugar levels on waking up is 90-130 and about 2 hours after meal is 130-160  Please bring your blood sugar monitor to each visit, thank you

## 2015-12-13 MED FILL — FARXIGA 10 MG TABLET: 10 | 30 days supply | Qty: 30 | Fill #2

## 2015-12-13 MED FILL — !LEVEMIR FLEXPEN 100UNITS/M: 100U/ML (3) | 7 days supply | Qty: 9 | Fill #2

## 2015-12-13 MED FILL — METFORMIN HCL ER 500 MG TAB: 500 | 30 days supply | Qty: 60 | Fill #1

## 2015-12-23 ENCOUNTER — Other Ambulatory Visit (INDEPENDENT_AMBULATORY_CARE_PROVIDER_SITE_OTHER): Payer: Self-pay

## 2015-12-23 DIAGNOSIS — Z794 Long term (current) use of insulin: Secondary | ICD-10-CM

## 2015-12-23 DIAGNOSIS — E1165 Type 2 diabetes mellitus with hyperglycemia: Secondary | ICD-10-CM

## 2015-12-23 LAB — BASIC METABOLIC PANEL
BUN: 10 mg/dL (ref 6–23)
CALCIUM: 9.6 mg/dL (ref 8.4–10.5)
CO2: 30 mEq/L (ref 19–32)
Chloride: 104 mEq/L (ref 96–112)
Creatinine, Ser: 0.67 mg/dL (ref 0.40–1.50)
GFR: 130.65 mL/min (ref >60.00)
GLUCOSE: 252 mg/dL — AB (ref 70–99)
Potassium: 4.1 mEq/L (ref 3.5–5.1)
SODIUM: 140 meq/L (ref 135–145)

## 2015-12-23 LAB — MICROALBUMIN / CREATININE URINE RATIO
Creatinine,U: 27.7 mg/dL
MICROALB UR: 7.1 mg/dL — AB (ref 0.0–1.9)
MICROALB/CREAT RATIO: 25.6 mg/g (ref 0.0–30.0)

## 2015-12-23 LAB — LDL CHOLESTEROL, DIRECT: LDL DIRECT: 85 mg/dL

## 2015-12-23 LAB — LIPID PANEL
CHOL/HDL RATIO: 4
Cholesterol: 159 mg/dL (ref 0–200)
HDL: 37.3 mg/dL — ABNORMAL LOW (ref >39.00)
Triglycerides: 446 mg/dL — ABNORMAL HIGH (ref 0.0–149.0)

## 2015-12-28 ENCOUNTER — Ambulatory Visit (INDEPENDENT_AMBULATORY_CARE_PROVIDER_SITE_OTHER): Payer: Self-pay | Admitting: Endocrinology

## 2015-12-28 ENCOUNTER — Encounter: Payer: Self-pay | Admitting: Endocrinology

## 2015-12-28 VITALS — BP 110/68 | Ht 71.0 in | Wt 317.0 lb

## 2015-12-28 DIAGNOSIS — E1165 Type 2 diabetes mellitus with hyperglycemia: Secondary | ICD-10-CM

## 2015-12-28 DIAGNOSIS — Z794 Long term (current) use of insulin: Secondary | ICD-10-CM

## 2015-12-28 NOTE — Patient Instructions (Addendum)
INSULIN 75 IN AM AND 85 PM MEAL  Get new battery for meter and keep sugars in diary

## 2015-12-28 NOTE — Progress Notes (Signed)
Patient ID: Jason Hall, male   DOB: [birth year], 55 y.o.   MRN: [id number]           Reason for Appointment:  Follow-up for Type 2 Diabetes  Referring physician:Valerie Feliciana Rossetti  History of Present Illness:          Date of diagnosis of type 2 diabetes mellitus: 2000       Background history:   He is not clear what led to the diagnosis of diabetes  initially, was under the care of physicians out of town He may have been taking metformin initially and this has been continued Also at some point he was given glipizide He thinks he has been on insulin for about 5 years and various forms His blood sugars were relatively better controlled with 70/30 insulin in 2015 with his A1c 8.3 and 7.5 On his initial consultation in 4/17 he was changed from Levemir to 70/30 insulin twice a day as well as started on Invokana 100 mg and metformin was changed to metformin ER 1000 mg; glipizide was stopped  Recent history:   His blood sugars have been overall poorly controlled since 2016  His A1c was 10.8 last month  INSULIN regimen is:  Novolin 70/30, 70 units a.m. and 80 5 PM Non-insulin hypoglycemic drugs the patient is taking are: Metformin ER 2000 mg daily, Farxiga 10 mg daily  Current management, blood sugar patterns and problems identified:  He is still using a Generic monitor but it has not been working for the last 2 weeks  He was told to bring his blood sugar record and was also given a diary but he has not done so.  His insulin dose was increased on the last visit especially evening dose  He does report better blood sugars at home but lab glucose was 252 in the early afternoon  He has had variable compliance with diet and does not always stay away from high fat foods or snacks  Currently trying to walk up to 15 min.  His weight is down slightly      Side effects from medications have been: ?  Diarrhea from metformin  Compliance with the medical regimen: Irregular  Glucose  monitoring:  done 0- 2 times a day         Glucometer: True metrix  Blood Glucose readings by recall  A.m. readings around 9-11 AM = 154,  Pm 183   Self-care: The diet that the patient has been following is: tries to limit High-fat foods .     Drinks lite juice, usually avoiding sweetened soft drinks or tea  Typical meal intake: Breakfast is Cereal/grits               Dietician visit, most recent: 6/17               Exercise:  some walking recently   Weight history:  Wt Readings from Last 3 Encounters:  12/28/15 (!) 317 lb (143.8 kg)  11/16/15 (!) 320 lb (145.2 kg)  10/05/15 (!) 322 lb (146.1 kg)    Glycemic control:   Lab Results  Component Value Date   HGBA1C 10.8 (H) 11/11/2015   HGBA1C 10.5 (H) 08/18/2015   HGBA1C 11.2 05/05/2015   Lab Results  Component Value Date   MICROALBUR 7.1 (H) 12/23/2015   LDLCALC NOT CALC 05/17/2014   CREATININE 0.67 12/23/2015   Lab Results  Component Value Date   MICRALBCREAT 25.6 12/23/2015   Other active problems: See review of systems  Lab on 12/23/2015  Component Date Value Ref Range Status  . Sodium 12/23/2015 140  135 - 145 mEq/L Final  . Potassium 12/23/2015 4.1  3.5 - 5.1 mEq/L Final  . Chloride 12/23/2015 104  96 - 112 mEq/L Final  . CO2 12/23/2015 30  19 - 32 mEq/L Final  . Glucose, Bld 12/23/2015 252* 70 - 99 mg/dL Final  . BUN 12/23/2015 10  6 - 23 mg/dL Final  . Creatinine, Ser 12/23/2015 0.67  0.40 - 1.50 mg/dL Final  . Calcium 12/23/2015 9.6  8.4 - 10.5 mg/dL Final  . GFR 12/23/2015 130.65  >60.00 mL/min Final  . Microalb, Ur 12/23/2015 7.1* 0.0 - 1.9 mg/dL Final  . Creatinine,U 12/23/2015 27.7  mg/dL Final  . Microalb Creat Ratio 12/23/2015 25.6  0.0 - 30.0 mg/g Final  . Cholesterol 12/23/2015 159  0 - 200 mg/dL Final  . Triglycerides 12/23/2015 446.0 Triglyceride is over 400; calculations on Lipids are invalid.* 0.0 - 149.0 mg/dL Final  . HDL 12/23/2015 37.30* >39.00 mg/dL Final  . Total CHOL/HDL Ratio  12/23/2015 4   Final  . Direct LDL 12/23/2015 85.0  mg/dL Final        Medication List       Accurate as of 12/28/15  5:04 PM. Always use your most recent med list.          acetaminophen 325 MG tablet Commonly known as:  TYLENOL Take 2 tablets (650 mg total) by mouth every 6 (six) hours as needed for moderate pain.   amLODipine 10 MG tablet Commonly known as:  NORVASC Take 1 tablet (10 mg total) by mouth daily.   atorvastatin 40 MG tablet Commonly known as:  LIPITOR Take 1 tablet (40 mg total) by mouth daily.   busPIRone 10 MG tablet Commonly known as:  BUSPAR Take 20 mg by mouth 3 (three) times daily.   dapagliflozin propanediol 10 MG Tabs tablet Commonly known as:  FARXIGA Take 10 mg by mouth daily.   glucose blood test strip Commonly known as:  TRUE METRIX BLOOD GLUCOSE TEST Use as instructed   hydrochlorothiazide 25 MG tablet Commonly known as:  HYDRODIURIL Take 1 tablet (25 mg total) by mouth daily.   insulin NPH-regular Human (70-30) 100 UNIT/ML injection Commonly known as:  NOVOLIN 70/30 30 minutes before breakfast and dinner inject 35 units.   NOVOLIN 70/30 (70-30) 100 UNIT/ML injection Generic drug:  insulin NPH-regular Human INJECT 35 UNITS UNDER THE SKIN 30 MINUTES BEFORE BREAKFAST AND DINNER   Insulin Pen Needle 32G X 4 MM Misc Commonly known as:  ULTICARE MICRO PEN NEEDLES Give insulin twice per day for E11.9   INSULIN SYRINGE 1CC/28G 28G X 1/2" 1 ML Misc Used as instructed by PCP   levothyroxine 75 MCG tablet Commonly known as:  SYNTHROID, LEVOTHROID TAKE 1 TABLET BY MOUTH DAILY BEFORE BREAKFAST.   lisinopril 40 MG tablet Commonly known as:  PRINIVIL,ZESTRIL Take 1 tablet (40 mg total) by mouth daily.   metFORMIN 500 MG 24 hr tablet Commonly known as:  GLUCOPHAGE-XR TAKE 2 TABLETS BY MOUTH DAILY WITH SUPPER.   multivitamin with minerals Tabs tablet Take 1 tablet by mouth daily.   omega-3 acid ethyl esters 1 g capsule Commonly  known as:  LOVAZA Take 2 capsules (2 g total) by mouth 2 (two) times daily. To help lower triglycerides   sertraline 100 MG tablet Commonly known as:  ZOLOFT Take 100 mg by mouth 2 (two) times daily.   traZODone 100 MG  tablet Commonly known as:  DESYREL Take 300 mg by mouth at bedtime.   TRUE METRIX METER w/Device Kit USE AS INSTRUCTED   TRUEPLUS LANCETS 28G Misc USE AS INSTRUCTED       Allergies:  Allergies  Allergen Reactions  . Shellfish Allergy Nausea And Vomiting    Past Medical History:  Diagnosis Date  . 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 COPD (chronic obstructive pulmonary disease) ([place])   . Diabetes mellitus without complication ([place])   . Hypertension     Past Surgical History:  Procedure Laterality Date  . LEG SURGERY Right 2004   upper thigh from a spider bite    No family history on file.  Social History:  reports that he has been smoking Cigarettes.  He has never used smokeless tobacco. He reports that he does not drink alcohol or use drugs.      Review of Systems  Reportedly has had hypothyroidism, currently taking 75 g levothyroxine   Lab Results  Component Value Date   TSH 3.40 11/11/2015   TSH 2.588 02/18/2015   TSH 5.725 (H) 05/17/2014   FREET4 0.90 02/18/2015   FREET4 0.93 05/17/2014    HYPERCHOLESTEROLEMIA: He has been given Lipitor 40 mg daily and Lovaza by his PCP  Lab Results  Component Value Date   CHOL 159 12/23/2015   HDL 37.30 (L) 12/23/2015   LDLCALC NOT CALC 05/17/2014   LDLDIRECT 85.0 12/23/2015   TRIG (H) 12/23/2015    446.0 Triglyceride is over 400; calculations on Lipids are invalid.   CHOLHDL 4 12/23/2015    Hypertension: Blood pressure is now  controlled with lisinopril 40 mg, amlodipine and HCTZ, followed by PCP. Also on Farxiga  BP Readings from Last 3 Encounters:  12/28/15 110/68  11/16/15 (!) 176/98  10/05/15 (!) 144/84     Most recent eye exam was 2/17 with Dr  Mendel Ryder  Most recent foot exam: 3/17    Physical Examination:  BP 110/68   Ht _0  (1.803 m)   Wt (!) 317 lb (143.8 kg)   BMI 44.21 kg/m     ASSESSMENT:  Diabetes type 2, uncontrolled with A1c consistently around 11%   See history of present illness for detailed discussion of current diabetes management, blood sugar patterns and problems identified   His blood sugars are difficult to assess as he did not bring his blood sugar records for review Lab glucose was 252 With increasing insulin to much higher doses he  thinks his blood sugars are better Also taking metformin and [place] He has started doing a little walking and weight is improving now  He has not checked his blood sugar recently and showed him that his meter had run out of battery which she needs to replace  PLAN:   Increase insulin to 75 in a.m.--85 Better diet He was reminded of using blood sugar diary to keep a record of his glucose readings, currently his monitor cannot be downloaded  He will try to check some readings at bedtime also A1c on the next visit  Patient Instructions  INSULIN 75 IN AM AND 85 PM MEAL  Get new battery for meter and keep sugars in diary      Jason Hall 12/28/2015, 5:04 PM   Note: This office note was prepared with Dragon voice recognition system technology. Any transcriptional errors that result from this process are unintentional.

## 2016-01-05 ENCOUNTER — Other Ambulatory Visit: Payer: Self-pay | Admitting: Internal Medicine

## 2016-01-05 MED FILL — !LEVEMIR FLEXPEN 100UNITS/M: 100U/ML (3) | 7 days supply | Qty: 9 | Fill #3

## 2016-01-05 MED FILL — LISINOPRIL 40 MG TABLET: 40 | 30 days supply | Qty: 30 | Fill #4

## 2016-01-05 MED FILL — HYDROCHLOROTHIAZIDE 25 MG T: 25 | 30 days supply | Qty: 30 | Fill #5

## 2016-01-05 MED FILL — busPIRone HCL 10 MG TABS: 10 | 30 days supply | Qty: 180 | Fill #1

## 2016-01-05 MED FILL — ?BUPROPION HCL XL 150 MG TA: 150 | 30 days supply | Qty: 30 | Fill #1

## 2016-01-05 MED FILL — traZODone HCL 100 MG TABS: 100 | 30 days supply | Qty: 30 | Fill #1

## 2016-01-05 MED FILL — LEVOTHYROXINE 75 MCG TABLET: 75 | 30 days supply | Qty: 30 | Fill #0

## 2016-01-05 MED FILL — $Lovaza 1gm capsule: 1 | 30 days supply | Qty: 120 | Fill #1

## 2016-01-05 MED FILL — SERTRALINE HCL 100 MG TAB: 100 | 30 days supply | Qty: 60 | Fill #1

## 2016-01-16 ENCOUNTER — Ambulatory Visit: Payer: Self-pay

## 2016-01-16 MED FILL — METFORMIN HCL ER 500 MG TAB: 500 | 30 days supply | Qty: 60 | Fill #2

## 2016-01-16 MED FILL — FARXIGA 10 MG TABLET: 10 | 30 days supply | Qty: 30 | Fill #3

## 2016-01-23 ENCOUNTER — Ambulatory Visit: Payer: Self-pay | Attending: Internal Medicine

## 2016-02-14 ENCOUNTER — Other Ambulatory Visit: Payer: Self-pay | Admitting: Endocrinology

## 2016-02-14 MED FILL — !LEVEMIR FLEXPEN 100UNITS/M: 100U/ML (3) | 7 days supply | Qty: 9 | Fill #4

## 2016-02-14 MED FILL — FARXIGA 10 MG TABLET: 10 | 30 days supply | Qty: 30 | Fill #0

## 2016-02-14 MED FILL — METFORMIN HCL ER 500 MG TAB: 500 | 30 days supply | Qty: 60 | Fill #3

## 2016-02-21 ENCOUNTER — Other Ambulatory Visit: Payer: Self-pay | Admitting: Internal Medicine

## 2016-02-21 DIAGNOSIS — I10 Essential (primary) hypertension: Secondary | ICD-10-CM

## 2016-02-21 MED FILL — HYDROCHLOROTHIAZIDE 25 MG T: 25 | 30 days supply | Qty: 30 | Fill #0

## 2016-02-21 MED FILL — ?BUPROPION HCL XL 300 MG TA: 300 | 30 days supply | Qty: 30 | Fill #0

## 2016-03-02 ENCOUNTER — Other Ambulatory Visit (INDEPENDENT_AMBULATORY_CARE_PROVIDER_SITE_OTHER): Payer: Self-pay

## 2016-03-02 DIAGNOSIS — E1165 Type 2 diabetes mellitus with hyperglycemia: Secondary | ICD-10-CM

## 2016-03-02 DIAGNOSIS — Z794 Long term (current) use of insulin: Secondary | ICD-10-CM

## 2016-03-02 LAB — BASIC METABOLIC PANEL
BUN: 11 mg/dL (ref 6–23)
CHLORIDE: 107 meq/L (ref 96–112)
CO2: 27 mEq/L (ref 19–32)
CREATININE: 0.56 mg/dL (ref 0.40–1.50)
Calcium: 8.9 mg/dL (ref 8.4–10.5)
GFR: 160.58 mL/min (ref >60.00)
Glucose, Bld: 222 mg/dL — ABNORMAL HIGH (ref 70–99)
POTASSIUM: 4.2 meq/L (ref 3.5–5.1)
Sodium: 141 mEq/L (ref 135–145)

## 2016-03-02 LAB — LDL CHOLESTEROL, DIRECT: LDL DIRECT: 155 mg/dL

## 2016-03-02 LAB — LIPID PANEL
CHOL/HDL RATIO: 7
Cholesterol: 266 mg/dL — ABNORMAL HIGH (ref 0–200)
HDL: 38.3 mg/dL — AB (ref >39.00)

## 2016-03-02 LAB — HEMOGLOBIN A1C: HEMOGLOBIN A1C: 10.1 % — AB (ref 4.6–6.5)

## 2016-03-06 ENCOUNTER — Encounter: Payer: Self-pay | Admitting: Endocrinology

## 2016-03-06 ENCOUNTER — Ambulatory Visit (INDEPENDENT_AMBULATORY_CARE_PROVIDER_SITE_OTHER): Payer: Self-pay | Admitting: Endocrinology

## 2016-03-06 ENCOUNTER — Other Ambulatory Visit: Payer: Self-pay | Admitting: *Deleted

## 2016-03-06 VITALS — BP 170/100 | HR 98 | Ht 70.67 in | Wt 314.8 lb

## 2016-03-06 DIAGNOSIS — E1165 Type 2 diabetes mellitus with hyperglycemia: Secondary | ICD-10-CM

## 2016-03-06 DIAGNOSIS — Z794 Long term (current) use of insulin: Secondary | ICD-10-CM

## 2016-03-06 DIAGNOSIS — I10 Essential (primary) hypertension: Secondary | ICD-10-CM

## 2016-03-06 DIAGNOSIS — E7800 Pure hypercholesterolemia, unspecified: Secondary | ICD-10-CM

## 2016-03-06 MED ORDER — "INSULIN SYRINGE 28G X 1/2"" 1 ML MISC": 11 refills | Status: AC

## 2016-03-06 MED ORDER — ATORVASTATIN CALCIUM 40 MG PO TABS: 40.0000 mg | ORAL_TABLET | Freq: Every day | ORAL | 2 refills | Status: DC

## 2016-03-06 MED ORDER — AMLODIPINE BESYLATE 10 MG PO TABS: 10.0000 mg | ORAL_TABLET | Freq: Every day | ORAL | 5 refills | Status: AC

## 2016-03-06 MED FILL — ?AMLODIPINE BESYLATE 10 MG: 10 | 30 days supply | Qty: 30 | Fill #0

## 2016-03-06 MED FILL — TRUEPLUS SYR 0.3ML 31GX5/16: 31G X 5/16" | 30 days supply | Qty: 100 | Fill #0

## 2016-03-06 MED FILL — ?ATORVASTATIN 40MG TABLET: 40 | 30 days supply | Qty: 30 | Fill #0

## 2016-03-06 NOTE — Patient Instructions (Addendum)
Check blood sugars on waking up  3x weekly  Also check blood sugars about 2 hours after a meal and do this after different meals by rotation  Recommended blood sugar levels on waking up is 90-130 and about 2 hours after meal is 130-160  Please bring your blood sugar monitor to each visit, thank you  [facility] Dr

## 2016-03-06 NOTE — Progress Notes (Signed)
Patient ID: Jason Hall, male   DOB: 26-May-1960, 56 y.o.   MRN: [id number]           Reason for Appointment:  Follow-up for Type 2 Diabetes   History of Present Illness:          Date of diagnosis of type 2 diabetes mellitus: 2000       Background history:   He is not clear what led to the diagnosis of diabetes  initially, was under the care of physicians out of town He may have been taking metformin initially and this has been continued Also at some point he was given glipizide He thinks he has been on insulin for about 5 years and various forms His blood sugars were relatively better controlled with 70/30 insulin in 2015 with his A1c 8.3 and 7.5 On his initial consultation in 4/17 he was changed from Levemir to 70/30 insulin twice a day as well as started on Invokana 100 mg and metformin was changed to metformin ER 1000 mg; glipizide was stopped  Recent history:   His blood sugars have been overall poorly controlled since 2016  His A1c was 10.1 last month  INSULIN regimen is:  Novolin 70/30, ?  75 units a.m. and 85 PM  Non-insulin hypoglycemic drugs the patient is taking are: Metformin ER 2000 mg daily, Farxiga 10 mg daily  Current management, blood sugar patterns and problems identified:  He is still not monitoring his blood sugar, he was told to continue battery but he still could not figure out what to take on the drug store for his meter  He was told to keep a record of his insulin and blood sugars and he has not done so  He is very inconsistent about how much insulin he is taking and is giving a variety of answers ranging from 50 up to 85 units  Not clear if he is taking his insulin consistently once though he thinks he is taking it twice a day, has not taken it today  Blood sugar was over 200 in the lab  He thinks he is taking his Wilder Glade regularly  Still not losing weight, he thinks he does not go to fast food restaurants  His weight is down slightly      Side effects from medications have been: ?  Diarrhea from metformin  Compliance with the medical regimen: Irregular  Glucose monitoring:  done 0 times a day         Glucometer: True metrix  Blood Glucose readings by recall  A.m. readings around 9-11 AM = 154,  Pm 183   Self-care: The diet that the patient has been following is: tries to limit High-fat foods .     Drinks lite juice, usually avoiding sweetened soft drinks or tea  Typical meal intake: Breakfast is Cereal/grits               Dietician visit, most recent: 6/17 and 7/17 with [facility]               Exercise:  some walking at times   Weight history:  Wt Readings from Last 3 Encounters:  03/06/16 (!) 314 lb 12.8 oz (142.8 kg)  12/28/15 (!) 317 lb (143.8 kg)  11/16/15 (!) 320 lb (145.2 kg)    Glycemic control:   Lab Results  Component Value Date   HGBA1C 10.1 (H) 03/02/2016   HGBA1C 10.8 (H) 11/11/2015   HGBA1C 10.5 (H) 08/18/2015   Lab Results  Component Value  Date   MICROALBUR 7.1 (H) 12/23/2015   LDLCALC NOT CALC 05/17/2014   CREATININE 0.56 03/02/2016   Lab Results  Component Value Date   MICRALBCREAT 25.6 12/23/2015   Other active problems: See review of systems   Lab on 03/02/2016  Component Date Value Ref Range Status  . Hgb A1c MFr Bld 03/02/2016 10.1* 4.6 - 6.5 % Final  . Cholesterol 03/02/2016 266* 0 - 200 mg/dL Final  . Triglycerides 03/02/2016 475.0 Triglyceride is over 400; calculations on Lipids are invalid.* 0.0 - 149.0 mg/dL Final  . HDL 03/02/2016 38.30* >39.00 mg/dL Final  . Total CHOL/HDL Ratio 03/02/2016 7   Final  . Sodium 03/02/2016 141  135 - 145 mEq/L Final  . Potassium 03/02/2016 4.2  3.5 - 5.1 mEq/L Final  . Chloride 03/02/2016 107  96 - 112 mEq/L Final  . CO2 03/02/2016 27  19 - 32 mEq/L Final  . Glucose, Bld 03/02/2016 222* 70 - 99 mg/dL Final  . BUN 03/02/2016 11  6 - 23 mg/dL Final  . Creatinine, Ser 03/02/2016 0.56  0.40 - 1.50 mg/dL Final  . Calcium 03/02/2016 8.9  8.4  - 10.5 mg/dL Final  . GFR 03/02/2016 160.58  >60.00 mL/min Final  . Direct LDL 03/02/2016 155.0  mg/dL Final      Allergies as of 03/06/2016      Reactions   Shellfish Allergy Nausea And Vomiting      Medication List       Accurate as of 03/06/16  3:27 PM. Always use your most recent med list.          acetaminophen 325 MG tablet Commonly known as:  TYLENOL Take 2 tablets (650 mg total) by mouth every 6 (six) hours as needed for moderate pain.   amLODipine 10 MG tablet Commonly known as:  NORVASC Take 1 tablet (10 mg total) by mouth daily.   atorvastatin 40 MG tablet Commonly known as:  LIPITOR Take 1 tablet (40 mg total) by mouth daily.   busPIRone 10 MG tablet Commonly known as:  BUSPAR Take 20 mg by mouth 3 (three) times daily.   FARXIGA 10 MG Tabs tablet Generic drug:  dapagliflozin propanediol TAKE 1 TABLET BY MOUTH DAILY.   glucose blood test strip Commonly known as:  TRUE METRIX BLOOD GLUCOSE TEST Use as instructed   hydrochlorothiazide 25 MG tablet Commonly known as:  HYDRODIURIL TAKE ONE TABLET BY MOUTH DAILY   insulin NPH-regular Human (70-30) 100 UNIT/ML injection Commonly known as:  NOVOLIN 70/30 30 minutes before breakfast and dinner inject 35 units.   NOVOLIN 70/30 (70-30) 100 UNIT/ML injection Generic drug:  insulin NPH-regular Human INJECT 35 UNITS UNDER THE SKIN 30 MINUTES BEFORE BREAKFAST AND DINNER   Insulin Pen Needle 32G X 4 MM Misc Commonly known as:  ULTICARE MICRO PEN NEEDLES Give insulin twice per day for E11.9   INSULIN SYRINGE 1CC/28G 28G X 1/2" 1 ML Misc Used as instructed by PCP   levothyroxine 75 MCG tablet Commonly known as:  SYNTHROID, LEVOTHROID TAKE 1 TABLET BY MOUTH DAILY BEFORE BREAKFAST.   lisinopril 40 MG tablet Commonly known as:  PRINIVIL,ZESTRIL Take 1 tablet (40 mg total) by mouth daily.   metFORMIN 500 MG 24 hr tablet Commonly known as:  GLUCOPHAGE-XR TAKE 2 TABLETS BY MOUTH DAILY WITH SUPPER.    multivitamin with minerals Tabs tablet Take 1 tablet by mouth daily.   omega-3 acid ethyl esters 1 g capsule Commonly known as:  LOVAZA Take 2 capsules (  2 g total) by mouth 2 (two) times daily. To help lower triglycerides   sertraline 100 MG tablet Commonly known as:  ZOLOFT Take 100 mg by mouth 2 (two) times daily.   traZODone 100 MG tablet Commonly known as:  DESYREL Take 300 mg by mouth at bedtime.   TRUE METRIX METER w/Device Kit USE AS INSTRUCTED   TRUEPLUS LANCETS 28G Misc USE AS INSTRUCTED       Allergies:  Allergies  Allergen Reactions  . Shellfish Allergy Nausea And Vomiting    Past Medical History:  Diagnosis Date  . 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 COPD (chronic obstructive pulmonary disease) ([place])   . Diabetes mellitus without complication ([place])   . Hypertension     Past Surgical History:  Procedure Laterality Date  . LEG SURGERY Right 2004   upper thigh from a spider bite    No family history on file.  Social History:  reports that he has been smoking Cigarettes.  He has never used smokeless tobacco. He reports that he does not drink alcohol or use drugs.      Review of Systems  He has had hypothyroidism, currently taking 75 g levothyroxine   Lab Results  Component Value Date   TSH 3.40 11/11/2015   TSH 2.588 02/18/2015   TSH 5.725 (H) 05/17/2014   FREET4 0.90 02/18/2015   FREET4 0.93 05/17/2014    HYPERCHOLESTEROLEMIA: He has been given Lipitor 40 mg daily and Lovaza by his PCP Most likely is not taking his Lipitor, has not been given a recent prescription   Lab Results  Component Value Date   CHOL 266 (H) 03/02/2016   HDL 38.30 (L) 03/02/2016   LDLCALC NOT CALC 05/17/2014   LDLDIRECT 155.0 03/02/2016   TRIG (H) 03/02/2016    475.0 Triglyceride is over 400; calculations on Lipids are invalid.   CHOLHDL 7 03/02/2016    Hypertension: Blood pressure is Out of control.  He supposed to be taking  lisinopril 40 mg, amlodipine and HCTZ, followed by PCP. However has not had any follow-up with PCP  Also on Farxiga  BP Readings from Last 3 Encounters:  03/06/16 (!) 170/100  12/28/15 110/68  11/16/15 (!) 176/98     Most recent eye exam was 2/17 with Dr Mendel Ryder  Most recent foot exam: 3/17    Physical Examination:  BP (!) 170/100 (BP Location: Left Arm)   Pulse 98   Ht 5' 10.67" (1.795 m)   Wt (!) 314 lb 12.8 oz (142.8 kg)   SpO2 97%   BMI 44.32 kg/m     ASSESSMENT:  Diabetes type 2, uncontrolled with A1c consistently around 11%   See history of present illness for detailed discussion of current diabetes management, blood sugar patterns and problems identified  His A1c is still over 10%  His blood sugars are difficult to assess as he did not bring his blood sugar records for review Lab glucose was 222 It is unlikely that he is following instructions for his insulin as he is not remembering his dosage Also not clear if he is compliant with his oral medications  HYPERTENSION: Poorly controlled and not clear if this is from inadequate compliance  PLAN:   He will get a new battery for his blood sugar monitor Given a blood sugar diary to maintain blood sugar readings and insulin doses He will follow the instructions for his insulin doses consistently twice a day He will bring all his  medications to review his compliance Consider follow-up with diabetes educator again He does need to see his PCP for various other problems including poor control of hypertension Given refill for Lipitor and Norvasc as he may not be taking these consistently  Patient Instructions  Check blood sugars on waking up  3x weekly  Also check blood sugars about 2 hours after a meal and do this after different meals by rotation  Recommended blood sugar levels on waking up is 90-130 and about 2 hours after meal is 130-160  Please bring your blood sugar monitor to each visit, thank you  [facility]     Elayne Snare 03/06/2016, 3:27 PM   Note: This office note was prepared with Dragon voice recognition system technology. Any transcriptional errors that result from this process are unintentional.

## 2016-03-12 ENCOUNTER — Ambulatory Visit: Payer: Self-pay | Admitting: Internal Medicine

## 2016-03-21 ENCOUNTER — Ambulatory Visit: Payer: Self-pay | Admitting: Family Medicine

## 2016-03-27 ENCOUNTER — Emergency Department (HOSPITAL_COMMUNITY)
Admission: EM | Admit: 2016-03-27 | Discharge: 2016-03-28 | Disposition: A | Discharge status: Home / Self Care | Payer: Self-pay | Attending: Emergency Medicine | Admitting: Emergency Medicine

## 2016-03-27 ENCOUNTER — Encounter (HOSPITAL_COMMUNITY): Payer: Self-pay | Admitting: Emergency Medicine

## 2016-03-27 ENCOUNTER — Ambulatory Visit: Payer: Medicaid Other | Attending: Family Medicine | Admitting: Family Medicine

## 2016-03-27 ENCOUNTER — Other Ambulatory Visit: Payer: Self-pay | Admitting: Endocrinology

## 2016-03-27 VITALS — BP 144/90 | HR 121 | Temp 98.0°F | Resp 18 | Ht 69.0 in | Wt 313.0 lb

## 2016-03-27 DIAGNOSIS — F17210 Nicotine dependence, cigarettes, uncomplicated: Secondary | ICD-10-CM | POA: Insufficient documentation

## 2016-03-27 DIAGNOSIS — J449 Chronic obstructive pulmonary disease, unspecified: Secondary | ICD-10-CM | POA: Insufficient documentation

## 2016-03-27 DIAGNOSIS — Z79899 Other long term (current) drug therapy: Secondary | ICD-10-CM | POA: Insufficient documentation

## 2016-03-27 DIAGNOSIS — Z794 Long term (current) use of insulin: Secondary | ICD-10-CM | POA: Insufficient documentation

## 2016-03-27 DIAGNOSIS — E119 Type 2 diabetes mellitus without complications: Secondary | ICD-10-CM | POA: Insufficient documentation

## 2016-03-27 DIAGNOSIS — R739 Hyperglycemia, unspecified: Secondary | ICD-10-CM

## 2016-03-27 DIAGNOSIS — I10 Essential (primary) hypertension: Secondary | ICD-10-CM | POA: Diagnosis not present

## 2016-03-27 DIAGNOSIS — E1165 Type 2 diabetes mellitus with hyperglycemia: Secondary | ICD-10-CM | POA: Insufficient documentation

## 2016-03-27 LAB — CBC WITH DIFFERENTIAL/PLATELET
Basophils Absolute: 0 10*3/uL (ref 0.0–0.1)
Basophils Relative: 0 %
Eosinophils Absolute: 0.1 10*3/uL (ref 0.0–0.7)
Eosinophils Relative: 1 %
HCT: 48.9 % (ref 39.0–52.0)
Hemoglobin: 16.4 g/dL (ref 13.0–17.0)
Lymphocytes Relative: 27 %
Lymphs Abs: 2.8 10*3/uL (ref 0.7–4.0)
MCH: 29.8 pg (ref 26.0–34.0)
MCHC: 33.5 g/dL (ref 30.0–36.0)
MCV: 88.7 fL (ref 78.0–100.0)
Monocytes Absolute: 0.6 10*3/uL (ref 0.1–1.0)
Monocytes Relative: 6 %
Neutro Abs: 6.8 10*3/uL (ref 1.7–7.7)
Neutrophils Relative %: 66 %
Platelets: 277 10*3/uL (ref 150–400)
RBC: 5.51 MIL/uL (ref 4.22–5.81)
RDW: 14.4 % (ref 11.5–15.5)
WBC: 10.3 10*3/uL (ref 4.0–10.5)

## 2016-03-27 LAB — COMPREHENSIVE METABOLIC PANEL
ALT: 29 U/L (ref 17–63)
AST: 26 U/L (ref 15–41)
Albumin: 4 g/dL (ref 3.5–5.0)
Alkaline Phosphatase: 81 U/L (ref 38–126)
Anion gap: 15 (ref 5–15)
BUN: 16 mg/dL (ref 6–20)
CO2: 21 mmol/L — ABNORMAL LOW (ref 22–32)
Calcium: 9.8 mg/dL (ref 8.9–10.3)
Chloride: 101 mmol/L (ref 101–111)
Creatinine, Ser: 0.86 mg/dL (ref 0.61–1.24)
GFR calc Af Amer: >60 mL/min (ref >60)
GFR calc non Af Amer: >60 mL/min (ref >60)
Glucose, Bld: 282 mg/dL — ABNORMAL HIGH (ref 65–99)
Potassium: 3.7 mmol/L (ref 3.5–5.1)
Sodium: 137 mmol/L (ref 135–145)
Total Bilirubin: 0.4 mg/dL (ref 0.3–1.2)
Total Protein: 7.7 g/dL (ref 6.5–8.1)

## 2016-03-27 LAB — URINALYSIS, ROUTINE W REFLEX MICROSCOPIC
Bacteria, UA: NONE SEEN
Bilirubin Urine: NEGATIVE
Glucose, UA: >=500 mg/dL — AB
Ketones, ur: 5 mg/dL — AB
Leukocytes, UA: NEGATIVE
Nitrite: NEGATIVE
Protein, ur: NEGATIVE mg/dL
Specific Gravity, Urine: 1.025 (ref 1.005–1.030)
Squamous Epithelial / HPF: NONE SEEN
pH: 5 (ref 5.0–8.0)

## 2016-03-27 LAB — CBG MONITORING, ED: Glucose-Capillary: 284 mg/dL — ABNORMAL HIGH (ref 65–99)

## 2016-03-27 MED ORDER — SODIUM CHLORIDE 0.9 % IV BOLUS (SEPSIS)
1000.0000 mL | Freq: Once | INTRAVENOUS | Status: AC
Start: 2016-03-27 — End: 2016-03-28
  Administered 2016-03-28: 1000 mL via INTRAVENOUS

## 2016-03-27 MED ORDER — INSULIN ASPART 100 UNIT/ML ~~LOC~~ SOLN
10.0000 [IU] | Freq: Once | SUBCUTANEOUS | Status: AC
  Administered 2016-03-27: 10 [IU] via SUBCUTANEOUS

## 2016-03-27 MED ORDER — INSULIN ASPART 100 UNIT/ML ~~LOC~~ SOLN: 20.0000 [IU] | Freq: Once | SUBCUTANEOUS | Status: DC

## 2016-03-27 MED ORDER — SODIUM CHLORIDE 0.9 % IV BOLUS (SEPSIS)
250.0000 mL | Freq: Once | INTRAVENOUS | Status: AC
  Administered 2016-03-27: 250 mL via INTRAVENOUS

## 2016-03-27 MED FILL — METFORMIN HCL ER 500 MG TAB: 500 | 30 days supply | Qty: 60 | Fill #0

## 2016-03-27 MED FILL — !LEVEMIR FLEXPEN 100UNITS/M: 100U/ML (3) | 7 days supply | Qty: 9 | Fill #5

## 2016-03-27 MED FILL — FARXIGA 10 MG TABLET: 10 | 30 days supply | Qty: 30 | Fill #1

## 2016-03-27 NOTE — ED Notes (Signed)
POCT CBG resulted 284; Reita ClicheBobby, RN aware

## 2016-03-27 NOTE — ED Notes (Signed)
Patient up ambulatory to the bathroom without any difficulty or distress; patient will also provide an urine sample at this time

## 2016-03-27 NOTE — Progress Notes (Signed)
Patient is here for Diabetes

## 2016-03-27 NOTE — Progress Notes (Signed)
Subjective:   Patient ID: Jason Hall, male    DOB: 1960-06-08, 56 y.o.   MRN: [id number]  Chief Complaint  Patient presents with  . Diabetes   HPI   Jason Hall 56 y.o. male presents for diabetes. He is followed by a endocrinologist for his diabetes. History of recent visit to endocrinologist on 03/06/2016 for diabetes management. He reports only taking his Wilder Glade this am. He reports not taking his Metformin or Insulin as prescribed for 2 weeks. Reports CBG was 253 this morning. He denies any blurred vision, abdominal pain or wounds. In office today CBG was 552. Urinalysis dipstick showed ketones. After receiving insulin and IV fluids in office his CBG rechecked was 525. He also reports not taking his BP medications today, but reports taking them yesterday.    Past Medical History:  Diagnosis Date  . 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 COPD (chronic obstructive pulmonary disease) ([place])   . Diabetes mellitus without complication ([place])   . Hypertension     Past Surgical History:  Procedure Laterality Date  . LEG SURGERY Right 2004   upper thigh from a spider bite    No family history on file.  Social History   Social History  . Marital status: Single    Spouse name: N/A  . Number of children: N/A  . Years of education: N/A   Occupational History  . Not on file.   Social History Main Topics  . Smoking status: Light Tobacco Smoker    Types: Cigarettes  . Smokeless tobacco: Never Used     Comment: smoking 1 cig per month  . Alcohol use No  . Drug use: No  . Sexual activity: Not on file   Other Topics Concern  . Not on file   Social History Narrative  . No narrative on file    Outpatient Medications Prior to Visit  Medication Sig Dispense Refill  . acetaminophen (TYLENOL) 325 MG tablet Take 2 tablets (650 mg total) by mouth every 6 (six) hours as needed for moderate pain.    Marland Kitchen amLODipine (NORVASC) 10 MG tablet Take 1 tablet (10 mg  total) by mouth daily. 30 tablet 5  . atorvastatin (LIPITOR) 40 MG tablet Take 1 tablet (40 mg total) by mouth daily. 90 tablet 2  . Blood Glucose Monitoring Suppl (TRUE METRIX METER) w/Device KIT USE AS INSTRUCTED 1 kit 0  . busPIRone (BUSPAR) 10 MG tablet Take 20 mg by mouth 3 (three) times daily.    Marland Kitchen FARXIGA 10 MG TABS tablet TAKE 1 TABLET BY MOUTH DAILY. 30 tablet 3  . glucose blood (TRUE METRIX BLOOD GLUCOSE TEST) test strip Use as instructed 100 each 12  . insulin NPH-regular Human (NOVOLIN 70/30) (70-30) 100 UNIT/ML injection 30 minutes before breakfast and dinner inject 35 units. (Patient taking differently: 30 minutes before breakfast and dinner inject 65 units.) 90 mL 3  . Insulin Pen Needle (ULTICARE MICRO PEN NEEDLES) 32G X 4 MM MISC Give insulin twice per day for E11.9 100 each 12  . Insulin Syringe-Needle U-100 (INSULIN SYRINGE 1CC/28G) 28G X 1/2" 1 ML MISC Used as instructed by PCP 1 each 11  . levothyroxine (SYNTHROID, LEVOTHROID) 75 MCG tablet TAKE 1 TABLET BY MOUTH DAILY BEFORE BREAKFAST. 30 tablet 0  . lisinopril (PRINIVIL,ZESTRIL) 40 MG tablet Take 1 tablet (40 mg total) by mouth daily. 30 tablet 5  . Multiple Vitamin (MULTIVITAMIN WITH MINERALS) TABS tablet Take 1 tablet by mouth daily.    Marland Kitchen  NOVOLIN 70/30 (70-30) 100 UNIT/ML injection INJECT 35 UNITS UNDER THE SKIN 30 MINUTES BEFORE BREAKFAST AND DINNER 20 mL 2  . omega-3 acid ethyl esters (LOVAZA) 1 g capsule Take 2 capsules (2 g total) by mouth 2 (two) times daily. To help lower triglycerides 360 capsule 3  . sertraline (ZOLOFT) 100 MG tablet Take 100 mg by mouth 2 (two) times daily.    . traZODone (DESYREL) 100 MG tablet Take 300 mg by mouth at bedtime.    . TRUEPLUS LANCETS 28G MISC USE AS INSTRUCTED 100 each 5  . metFORMIN (GLUCOPHAGE-XR) 500 MG 24 hr tablet TAKE 2 TABLETS BY MOUTH DAILY WITH SUPPER. 60 tablet 3  . hydrochlorothiazide (HYDRODIURIL) 25 MG tablet TAKE ONE TABLET BY MOUTH DAILY 30 tablet 0   No  facility-administered medications prior to visit.     Allergies  Allergen Reactions  . Shellfish Allergy Nausea And Vomiting   Review of Systems  Constitutional: Negative.   Eyes: Negative.   Respiratory: Negative.   Cardiovascular: Negative.   Gastrointestinal: Negative.   Neurological: Negative.   Endo/Heme/Allergies: Positive for polydipsia.      Objective:    Physical Exam  Constitutional: He is oriented to person, place, and time. He appears well-developed and well-nourished.  Cardiovascular: Normal rate, regular rhythm, normal heart sounds and intact distal pulses.   Pulmonary/Chest: Effort normal and breath sounds normal.  Abdominal: Soft. Bowel sounds are normal.  Neurological: He is alert and oriented to person, place, and time.  Skin: Skin is warm and dry.  Nursing note and vitals reviewed.  BP (!) 144/90 (BP Location: Right Arm, Patient Position: Sitting, Cuff Size: Normal)   Pulse (!) 121   Temp 98 F (36.7 C) (Oral)   Resp 18   Ht _0  (1.753 m)   Wt (!) 313 lb (142 kg)   SpO2 94%   BMI 46.22 kg/m  Wt Readings from Last 3 Encounters:  03/27/16 (!) 313 lb (142 kg)  03/06/16 (!) 314 lb 12.8 oz (142.8 kg)  12/28/15 (!) 317 lb (143.8 kg)   Immunization History  Administered Date(s) Administered  . Influenza,inj,Quad PF,36+ Mos 02/18/2015, 12/15/2015  . Pneumococcal Polysaccharide-23 09/24/2013    Lab Results  Component Value Date   TSH 3.40 11/11/2015   Lab Results  Component Value Date   WBC 10.0 09/28/2013   HGB 13.8 09/28/2013   HCT 43.7 09/28/2013   MCV 91.6 09/28/2013   PLT 286 09/28/2013   Lab Results  Component Value Date   NA 141 03/02/2016   K 4.2 03/02/2016   CO2 27 03/02/2016   GLUCOSE 222 (H) 03/02/2016   BUN 11 03/02/2016   CREATININE 0.56 03/02/2016   BILITOT 0.4 08/18/2015   ALKPHOS 79 08/18/2015   AST 32 08/18/2015   ALT 35 08/18/2015   PROT 7.8 08/18/2015   ALBUMIN 4.3 08/18/2015   CALCIUM 8.9 03/02/2016   ANIONGAP  15 09/22/2013   GFR 160.58 03/02/2016   Lab Results  Component Value Date   CHOL 266 (H) 03/02/2016   CHOL 159 12/23/2015   CHOL 264 (H) 05/17/2014   Lab Results  Component Value Date   HDL 38.30 (L) 03/02/2016   HDL 37.30 (L) 12/23/2015   HDL 33 (L) 05/17/2014   Lab Results  Component Value Date   LDLCALC NOT CALC 05/17/2014   LDLCALC NOT CALC 02/08/2014   Lab Results  Component Value Date   TRIG (H) 03/02/2016    475.0 Triglyceride is over 400; calculations  on Lipids are invalid.   TRIG (H) 12/23/2015    446.0 Triglyceride is over 400; calculations on Lipids are invalid.   TRIG 558 (H) 05/17/2014   Lab Results  Component Value Date   CHOLHDL 7 03/02/2016   CHOLHDL 4 12/23/2015   CHOLHDL 8.0 05/17/2014   Lab Results  Component Value Date   HGBA1C 10.1 (H) 03/02/2016   HGBA1C 10.8 (H) 11/11/2015   HGBA1C 10.5 (H) 08/18/2015      Assessment & Plan:   Problem List Items Addressed This Visit      Endocrine   Diabetes mellitus without complication ([place]) - Primary   -Patient given insulin and IV fluids per protocol. CBG still elevated.    -Patient referred to ED. He verbalizes understanding of the situation and is agreeable to plan. He states        he is well enough to transport by private vehicle.    Relevant Medications   insulin aspart (novoLOG) injection 20 Units   insulin aspart (novoLOG) injection 10 Units (Completed)   sodium chloride 0.9 % bolus 250 mL (Completed)   Other Relevant Orders   Glucose (CBG)   Urinalysis Dipstick   BASIC METABOLIC PANEL WITH GFR   Microalbumin / creatinine urine ratio     Meds ordered this encounter  Medications  . insulin aspart (novoLOG) injection 20 Units  . insulin aspart (novoLOG) injection 10 Units  . sodium chloride 0.9 % bolus 250 mL    Follow up: Patient referred to ED.  Fredia Beets, FNP

## 2016-03-27 NOTE — ED Triage Notes (Signed)
Pt sent to ED by his MD ref. Hyperglycemia.

## 2016-03-27 NOTE — Patient Instructions (Signed)
Go to ED for further treatment    Type 2 Diabetes Mellitus, Self Care, Adult When you have type 2 diabetes (type 2 diabetes mellitus), you must keep your blood sugar (glucose) under control. You can do this with:  Nutrition.  Exercise.  Lifestyle changes.  Medicines or insulin, if needed.  Support from your doctors and others. How do I manage my blood sugar?  Check your blood sugar level every day, as often as told.  Call your doctor if your blood sugar is above your goal numbers for 2 tests in a row.  Have your A1c (hemoglobin A1c) level checked at least two times a year. Have it checked more often if your doctor tells you to. Your doctor will set treatment goals for you. Generally, you should have these blood sugar levels:  Before meals (preprandial): 80-130 mg/dL (4.4-7.2 mmol/L).  After meals (postprandial): lower than 180 mg/dL (10 mmol/L).  A1c level: less than 7%. What do I need to know about high blood sugar? High blood sugar is called hyperglycemia. Know the signs of high blood sugar. Signs may include:  Feeling:  Thirsty.  Hungry.  Very tired.  Needing to pee (urinate) more than usual.  Blurry vision. What do I need to know about low blood sugar? Low blood sugar is called hypoglycemia. This is when blood sugar is at or below 70 mg/dL (3.9 mmol/L). Symptoms may include:  Feeling:  Hungry.  Worried or nervous (anxious).  Sweaty and clammy.  Confused.  Dizzy.  Sleepy.  Sick to your stomach (nauseous).  Having:  A fast heartbeat (palpitations).  A headache.  A change in your vision.  Jerky movements that you cannot control (seizure).  Nightmares.  Tingling or no feeling (numbness) around the mouth, lips, or tongue.  Having trouble with:  Talking.  Paying attention (concentrating).  Moving (coordination).  Sleeping.  Shaking.  Passing out (fainting).  Getting upset easily (irritability). Treating low blood sugar  To  treat low blood sugar, eat or drink something sugary right away. If you can think clearly and swallow safely, follow the 15:15 rule:  Take 15 grams of a fast-acting carb (carbohydrate). Some fast-acting carbs are:  1 tube of glucose gel.  3 sugar tablets (glucose pills).  6-8 pieces of hard candy.  4 oz (120 mL) of fruit juice.  4 oz (120 mL) regular (not diet) soda.  Check your blood sugar 15 minutes after you take the carb.  If your blood sugar is still at or below 70 mg/dL (3.9 mmol/L), take 15 grams of a carb again.  If your blood sugar does not go above 70 mg/dL (3.9 mmol/L) after 3 tries, get help right away.  After your blood sugar goes back to normal, eat a meal or a snack within 1 hour. Treating very low blood sugar  If your blood sugar is at or below 54 mg/dL (3 mmol/L), you have very low blood sugar (severe hypoglycemia). This is an emergency. Do not wait to see if the symptoms will go away. Get medical help right away. Call your local emergency services (911 in the [place] Do not drive yourself to the hospital. If you have very low blood sugar and you cannot eat or drink, you may need a glucagon shot (injection). A family member or friend should learn how to check your blood sugar and how to give you a glucagon shot. Ask your doctor if you need to have a glucagon shot kit at home. What else is important  to manage my diabetes? Medicine  Follow these instructions about insulin and diabetes medicines:  Take them as told by your doctor.  Adjust them as told by your doctor.  Do not run out of them. Having diabetes can raise your risk for other long-term conditions. These include heart or kidney disease. Your doctor may prescribe medicines to help prevent problems from diabetes. Food   Make healthy food choices. These include:  Chicken, fish, egg whites, and beans.  Oats, whole wheat, bulgur, brown rice, quinoa, and millet.  Fresh fruits and vegetables.  Low-fat dairy  products.  Nuts, avocado, olive oil, and canola oil.  Make a food plan with a specialist (dietitian).  Follow instructions from your doctor about what you cannot eat or drink.  Drink enough fluid to keep your pee (urine) clear or pale yellow.  Eat healthy snacks between healthy meals.  Keep track of carbs that you eat. Read food labels. Learn food serving sizes.  Follow your sick day plan when you cannot eat or drink normally. Make this plan with your doctor so it is ready to use. Activity  Exercise at least 3 times a week.  Do not go more than 2 days without exercising.  Talk with your doctor before you start a new exercise. Your doctor may need to adjust your insulin, medicines, or food. Lifestyle   Do not use any tobacco products. These include cigarettes, chewing tobacco, and e-cigarettes.If you need help quitting, ask your doctor.  Ask your doctor how much alcohol is safe for you.  Learn to deal with stress. If you need help with this, ask your doctor. Body care  Stay up to date with your shots (immunizations).  Have your eyes and feet checked by a doctor as often as told.  Check your skin and feet every day. Check for cuts, bruises, redness, blisters, or sores.  Brush your teeth and gums two times a day.  Floss at least one time a day.  Go to the dentist least one time every 6 months.  Stay at a healthy weight. General instructions   Take over-the-counter and prescription medicines only as told by your doctor.  Share your diabetes care plan with:  Your work or school.  People you live with.  Check your pee (urine) for ketones:  When you are sick.  As told by your doctor.  Carry a card or wear jewelry that says that you have diabetes.  Ask your doctor:  Do I need to meet with a diabetes educator?  Where can I find a support group for people with diabetes?  Keep all follow-up visits as told by your doctor. This is important. Where to find more  information: To learn more about diabetes, visit:  American Diabetes Association: www.diabetes.org  American Association of Diabetes Educators: www.diabeteseducator.org/patient-resources This information is not intended to replace advice given to you by your health care provider. Make sure you discuss any questions you have with your health care provider. Document Released: 06/13/2015 Document Revised: 07/28/2015 Document Reviewed: 03/25/2015 Elsevier Interactive Patient Education  2017 [employer].

## 2016-03-28 ENCOUNTER — Telehealth: Payer: Self-pay | Admitting: *Deleted

## 2016-03-28 LAB — BASIC METABOLIC PANEL WITH GFR
BUN: 18 mg/dL (ref 7–25)
CALCIUM: 9.6 mg/dL (ref 8.6–10.3)
CHLORIDE: 98 mmol/L (ref 98–110)
CO2: 22 mmol/L (ref 20–31)
CREATININE: 1 mg/dL (ref 0.70–1.33)
GFR, Est African American: >89 mL/min (ref >=60)
GFR, Est Non African American: 84 mL/min (ref >=60)
Glucose, Bld: 460 mg/dL — ABNORMAL HIGH (ref 65–99)
Potassium: 4.1 mmol/L (ref 3.5–5.3)
SODIUM: 134 mmol/L — AB (ref 135–146)

## 2016-03-28 LAB — CBG MONITORING, ED: GLUCOSE-CAPILLARY: 209 mg/dL — AB (ref 65–99)

## 2016-03-28 LAB — POCT URINALYSIS DIPSTICK
Bilirubin, UA: NEGATIVE
Blood, UA: NEGATIVE
GLUCOSE UA: 500
Ketones, UA: 15
Leukocytes, UA: NEGATIVE
NITRITE UA: NEGATIVE
PROTEIN UA: NEGATIVE
UROBILINOGEN UA: 0.2
pH, UA: 5

## 2016-03-28 NOTE — Discharge Instructions (Signed)
Check blood sugars on waking up  3x weekly   Also check blood sugars about 2 hours after a meal and do this after different meals by rotation   Recommended blood sugar levels on waking up is 90-130 and about 2 hours after meal is 130-160   Please bring your blood sugar monitor to each visit with your PCP and endocrinologist

## 2016-03-28 NOTE — ED Provider Notes (Signed)
By signing my name below, I, Evelene Croon, attest that this documentation has been prepared under the direction and in the presence of [place], DO . Electronically Signed: Evelene Croon, Scribe. 03/28/2016. 1:05 AM.  TIME SEEN: 1:06 AM  CHIEF COMPLAINT:  Chief Complaint  Patient presents with  . Hyperglycemia     HPI:   HPI Comments:  Jason Hall is a 56 y.o. male with a history of IDDM, who presents to the Emergency Department complaining of elevated blood sugar today. His blood sugar was in the 500s at Urgent Care where he we went for a routine physical.  He was then sent to the ED for further evaluation. It appears patient was given 30 units of NovoLog and 250 mL of IV fluids at urgent care. Blood sugar went from 552 down to 525. He states he was aware that his blood sugar was elevated but has been otherwise asymptomatic. There is questionable medical non-compliance due to confusion of what diabetes meds he should be taking. He has also been out of his testing strips but pt states he will be able to refill his meds tomorrow and will be given a glucometer and strips. He denies fever, cough, or vomiting. States he has chronic diarrhea that is unchanged. Denies any pain currently. It appears patient is supposed to be on Farxiga, metformin, Novolin 70/30. He also states he is supposed to be on Levemir.    ROS: See HPI Constitutional: no fever  Eyes: no drainage  ENT: no runny nose   Cardiovascular:  no chest pain  Resp: no SOB  GI: no vomiting; chronic diarrhea GU: no dysuria Integumentary: no rash  Allergy: no hives  Musculoskeletal: no leg swelling  Neurological: no slurred speech ROS otherwise negative  PAST MEDICAL HISTORY/PAST SURGICAL HISTORY:  Past Medical History:  Diagnosis Date  . 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 COPD (chronic obstructive pulmonary disease) ([place])   . Diabetes mellitus without complication ([place])   . Hypertension      MEDICATIONS:  Prior to Admission medications   Medication Sig Start Date End Date Taking? Authorizing Provider  acetaminophen (TYLENOL) 325 MG tablet Take 2 tablets (650 mg total) by mouth every 6 (six) hours as needed for moderate pain. 09/28/13   Emina Riebock, NP  amLODipine (NORVASC) 10 MG tablet Take 1 tablet (10 mg total) by mouth daily. 03/06/16   Elayne Snare, MD  atorvastatin (LIPITOR) 40 MG tablet Take 1 tablet (40 mg total) by mouth daily. 03/06/16   Elayne Snare, MD  Blood Glucose Monitoring Suppl (TRUE METRIX METER) w/Device KIT USE AS INSTRUCTED 04/21/15   Lance Bosch, NP  busPIRone (BUSPAR) 10 MG tablet Take 20 mg by mouth 3 (three) times daily.    Historical Provider, MD  FARXIGA 10 MG TABS tablet TAKE 1 TABLET BY MOUTH DAILY. 02/14/16   Elayne Snare, MD  glucose blood (TRUE METRIX BLOOD GLUCOSE TEST) test strip Use as instructed 04/21/15   Lance Bosch, NP  hydrochlorothiazide (HYDRODIURIL) 25 MG tablet TAKE ONE TABLET BY MOUTH DAILY 02/21/16   Tresa Garter, MD  insulin NPH-regular Human (NOVOLIN 70/30) (70-30) 100 UNIT/ML injection 30 minutes before breakfast and dinner inject 35 units. Patient taking differently: 30 minutes before breakfast and dinner inject 65 units. 09/20/15   Tresa Garter, MD  Insulin Pen Needle (ULTICARE MICRO PEN NEEDLES) 32G X 4 MM MISC Give insulin twice per day for E11.9 02/18/15   Mateo Flow  [place], NP  Insulin Syringe-Needle U-100 (INSULIN SYRINGE 1CC/28G) 28G X 1/2" 1 ML MISC Used as instructed by PCP 03/06/16   Tresa Garter, MD  levothyroxine (SYNTHROID, LEVOTHROID) 75 MCG tablet TAKE 1 TABLET BY MOUTH DAILY BEFORE BREAKFAST. 01/05/16   Tresa Garter, MD  lisinopril (PRINIVIL,ZESTRIL) 40 MG tablet Take 1 tablet (40 mg total) by mouth daily. 02/18/15   Lance Bosch, NP  metFORMIN (GLUCOPHAGE-XR) 500 MG 24 hr tablet TAKE 2 TABLETS BY MOUTH DAILY WITH SUPPER. 03/27/16   Elayne Snare, MD  Multiple Vitamin (MULTIVITAMIN WITH MINERALS)  TABS tablet Take 1 tablet by mouth daily.    Historical Provider, MD  NOVOLIN 70/30 (70-30) 100 UNIT/ML injection INJECT 35 UNITS UNDER THE SKIN 30 MINUTES BEFORE BREAKFAST AND DINNER 11/08/15   Elayne Snare, MD  omega-3 acid ethyl esters (LOVAZA) 1 g capsule Take 2 capsules (2 g total) by mouth 2 (two) times daily. To help lower triglycerides 09/20/15   Tresa Garter, MD  sertraline (ZOLOFT) 100 MG tablet Take 100 mg by mouth 2 (two) times daily.    Historical Provider, MD  traZODone (DESYREL) 100 MG tablet Take 300 mg by mouth at bedtime.    Historical Provider, MD  TRUEPLUS LANCETS 28G MISC USE AS INSTRUCTED 04/21/15   Lance Bosch, NP    ALLERGIES:  Allergies  Allergen Reactions  . Shellfish Allergy Nausea And Vomiting    SOCIAL HISTORY:  Social History  Substance Use Topics  . Smoking status: Light Tobacco Smoker    Types: Cigarettes  . Smokeless tobacco: Never Used     Comment: smoking 1 cig per month  . Alcohol use No    FAMILY HISTORY: No family history on file.  EXAM: BP 152/88 (BP Location: Left Arm)   Pulse 103   Temp 97.7 F (36.5 C) (Oral)   Resp 18   Ht 5' 9.5" (1.765 m)   Wt (!) 313 lb (142 kg)   SpO2 95%   BMI 45.56 kg/m  CONSTITUTIONAL: Alert and oriented and responds appropriately to questions. Obese, chronically ill appearing  HEAD: Normocephalic EYES: Conjunctivae clear, PERRL, EOMI ENT: normal nose; no rhinorrhea; moist mucous membranes NECK: Supple, no meningismus, no nuchal rigidity, no LAD  CARD: RRR; S1 and S2 appreciated; no murmurs, no clicks, no rubs, no gallops RESP: Normal chest excursion without splinting or tachypnea; breath sounds clear and equal bilaterally; no wheezes, no rhonchi, no rales, no hypoxia or respiratory distress, speaking full sentences ABD/GI: Normal bowel sounds; non-distended; soft, non-tender, no rebound, no guarding, no peritoneal signs, no hepatosplenomegaly BACK:  The back appears normal and is non-tender to  palpation, there is no CVA tenderness EXT: Normal ROM in all joints; non-tender to palpation; no edema; normal capillary refill; no cyanosis, no calf tenderness or swelling    SKIN: Normal color for age and race; warm; no rash NEURO: Moves all extremities equally, sensation to light touch intact diffusely, cranial nerves II through XII intact, normal speech PSYCH: The patient's mood and manner are appropriate. Grooming and personal hygiene are appropriate.  MEDICAL DECISION MAKING: Patient here with hyperglycemia. He is asymptomatic. Blood glucose in the emergency department at 7:30 PM was 284. He states he has been fasting since 2:30 PM. Slightly decreased bicarbonate 21 with anion gap of 15 and only 5 ketones in his urine. He does not appear to be in DKA.  Will give a liter of IV fluids and recheck his blood sugar. He states that he  should have refills of all of his medications including supplies that he needs to check his blood sugar supplied to him by [facility] and wellness tomorrow. He declines any refills, prescriptions for equipment from the emergency department.  ED PROGRESS: Blood glucose is now 209. I feel he is safe to be discharged home with outpatient follow-up. Again patient states that he has medications and supplies that he will be able to pick up tomorrow. Discussed return precautions. Patient verbalizes understanding and is comfortable with this plan.   At this time, I do not feel there is any life-threatening condition present. I have reviewed and discussed all results (EKG, imaging, lab, urine as appropriate) and exam findings with patient/family. I have reviewed nursing notes and appropriate previous records.  I feel the patient is safe to be discharged home without further emergent workup and can continue workup as an outpatient as needed. Discussed usual and customary return precautions. Patient/family verbalize understanding and are comfortable with this plan.  Outpatient  follow-up has been provided. All questions have been answered.    I personally performed the services described in this documentation, which was scribed in my presence. The recorded information has been reviewed and is accurate.     [place], DO 03/28/16 [phone]

## 2016-03-28 NOTE — Telephone Encounter (Addendum)
Jasmine from [facility] labs called to report Glucose level of 460  CRITICAL VALUE STICKER  CRITICAL VALUE: Glucose 460  RECEIVER (on-site recipient of call): Damita Dunningsravia Aroura Vasudevan,RN  DATE & TIME NOTIFIED: 03/28/16 @0904   MESSENGER (representative from lab): Leavy CellaJasmine  MD NOTIFIED: routed to  Arrie SenateMandesia Hairston, NP                            Will route to Dr. Hyman HopesJegede  TIME OF NOTIFICATION: 10:49

## 2016-03-29 ENCOUNTER — Other Ambulatory Visit: Payer: Self-pay | Admitting: Internal Medicine

## 2016-03-29 ENCOUNTER — Telehealth: Payer: Self-pay

## 2016-03-29 DIAGNOSIS — I10 Essential (primary) hypertension: Secondary | ICD-10-CM

## 2016-03-29 MED FILL — !NOVOLIN 70/30 100 UNITS/ML: (70-30) 100 | 28 days supply | Qty: 20 | Fill #0

## 2016-03-29 MED FILL — LISINOPRIL 40 MG TABLET: 40 | 30 days supply | Qty: 30 | Fill #0

## 2016-03-29 MED FILL — ?AMLODIPINE BESYLATE 10 MG: 10 | 30 days supply | Qty: 30 | Fill #1

## 2016-03-29 MED FILL — traZODone HCL 100 MG TABS: 100 | 30 days supply | Qty: 30 | Fill #0

## 2016-03-29 MED FILL — ?BUPROPION HCL XL 300 MG TA: 300 | 30 days supply | Qty: 30 | Fill #1

## 2016-03-29 MED FILL — LEVOTHYROXINE 75 MCG TABLET: 75 | 30 days supply | Qty: 30 | Fill #0

## 2016-03-29 MED FILL — ATORVASTATIN 40 MG TABLET: 40 | 30 days supply | Qty: 30 | Fill #1

## 2016-03-29 MED FILL — ?SERTRALINE HCL 100 MG TAB: 100 | 30 days supply | Qty: 60 | Fill #0

## 2016-03-29 MED FILL — HYDROCHLOROTHIAZIDE 25 MG T: 25 | 30 days supply | Qty: 30 | Fill #0

## 2016-03-29 MED FILL — busPIRone HCL 10 MG TABS: 10 | 30 days supply | Qty: 180 | Fill #0

## 2016-03-29 NOTE — Telephone Encounter (Signed)
CMA call to ogo over kidney function was normal'  Patient was aware and understood

## 2016-03-29 NOTE — Telephone Encounter (Signed)
-----   Message from [name], FNP sent at 03/29/2016  9:41 AM EST ----- -Kidney function normal -Continue to take your diabetic medications as prescribed by your endocrinologist for better blood sugar control.

## 2016-04-03 ENCOUNTER — Encounter: Payer: Self-pay | Admitting: Endocrinology

## 2016-04-03 ENCOUNTER — Ambulatory Visit (INDEPENDENT_AMBULATORY_CARE_PROVIDER_SITE_OTHER): Payer: Self-pay | Admitting: Endocrinology

## 2016-04-03 VITALS — BP 148/86 | HR 107 | Ht 69.0 in | Wt 315.0 lb

## 2016-04-03 DIAGNOSIS — E1165 Type 2 diabetes mellitus with hyperglycemia: Secondary | ICD-10-CM

## 2016-04-03 DIAGNOSIS — E119 Type 2 diabetes mellitus without complications: Secondary | ICD-10-CM

## 2016-04-03 DIAGNOSIS — Z794 Long term (current) use of insulin: Secondary | ICD-10-CM

## 2016-04-03 MED ORDER — INSULIN NPH ISOPHANE & REGULAR (70-30) 100 UNIT/ML ~~LOC~~ SUSP: SUBCUTANEOUS | 3 refills | Status: AC

## 2016-04-03 MED ORDER — INSULIN GLARGINE 300 UNIT/ML ~~LOC~~ SOPN: 40.0000 [IU] | PEN_INJECTOR | Freq: Every day | SUBCUTANEOUS | 3 refills | Status: DC

## 2016-04-03 MED ORDER — INSULIN PEN NEEDLE 32G X 4 MM MISC: 12 refills | Status: AC

## 2016-04-03 NOTE — Progress Notes (Signed)
Patient ID: Jason Hall, male   DOB: Aug 18, 1960, 56 y.o.   MRN: [id number]           Reason for Appointment:  Follow-up for Type 2 Diabetes   History of Present Illness:          Date of diagnosis of type 2 diabetes mellitus: 2000       Background history:   He is not clear what led to the diagnosis of diabetes  initially, was under the care of physicians out of town He may have been taking metformin initially and this has been continued Also at some point he was given glipizide He thinks he has been on insulin for about 5 years and various forms His blood sugars were relatively better controlled with 70/30 insulin in 2015 with his A1c 8.3 and 7.5 On his initial consultation in 4/17 he was changed from Levemir to 70/30 insulin twice a day as well as started on Invokana 100 mg and metformin was changed to metformin ER 1000 mg; glipizide was stopped  Recent history:   His blood sugars have been overall poorly controlled since 2016  His A1c was 10.1 last month  INSULIN regimen is:  Novolin 70/30, 85 units a.m. and 85 PM  Non-insulin hypoglycemic drugs the patient is taking are: Metformin ER 2000 mg daily, Farxiga 10 mg daily  Current management, blood sugar patterns and problems identified:  He started using a new glucose monitor a few days ago and has only a few readings  MORNING blood sugars are 263, 296, afternoon 321, 396 and late night 436  He now thinks that he is very consistent with his insulin and he can measure it reliably on his syringe  However since his prescription is for lower doses he may not be getting his full supply  He is also not getting his Wilder Glade or metformin consistently from his pharmacy  His weight is about the same     Side effects from medications have been: ?  Diarrhea from metformin  Compliance with the medical regimen: Irregular  Glucose monitoring:  done 0-1 times a day         Glucometer: True metrix  Blood Glucose readings by review  of his monitor  A.m. readings around 9-11 AM = 154,  Pm 183   Self-care: The diet that the patient has been following is: tries to limit High-fat foods .     Drinks lite juice, usually avoiding sweetened soft drinks or tea  Typical meal intake: Breakfast is Cereal/grits               Dietician visit, most recent: 6/17 and 7/17 with CDE               Exercise:  some walking at times   Weight history:  Wt Readings from Last 3 Encounters:  04/03/16 (!) 315 lb (142.9 kg)  03/27/16 (!) 313 lb (142 kg)  03/27/16 (!) 313 lb (142 kg)    Glycemic control:   Lab Results  Component Value Date   HGBA1C 10.1 (H) 03/02/2016   HGBA1C 10.8 (H) 11/11/2015   HGBA1C 10.5 (H) 08/18/2015   Lab Results  Component Value Date   MICROALBUR 7.1 (H) 12/23/2015   LDLCALC NOT CALC 05/17/2014   CREATININE 0.86 03/27/2016   Lab Results  Component Value Date   MICRALBCREAT 25.6 12/23/2015   Other active problems: See review of systems   Admission on 03/27/2016, Discharged on 03/28/2016  Component Date Value Ref  Range Status  . Glucose-Capillary 03/27/2016 284* 65 - 99 mg/dL Final  . Comment 1 03/27/2016 Notify RN   Final  . Comment 2 03/27/2016 Document in Chart   Final  . WBC 03/27/2016 10.3  4.0 - 10.5 K/uL Final  . RBC 03/27/2016 5.51  4.22 - 5.81 MIL/uL Final  . Hemoglobin 03/27/2016 16.4  13.0 - 17.0 g/dL Final  . HCT 03/27/2016 48.9  39.0 - 52.0 % Final  . MCV 03/27/2016 88.7  78.0 - 100.0 fL Final  . MCH 03/27/2016 29.8  26.0 - 34.0 pg Final  . MCHC 03/27/2016 33.5  30.0 - 36.0 g/dL Final  . RDW 03/27/2016 14.4  11.5 - 15.5 % Final  . Platelets 03/27/2016 277  150 - 400 K/uL Final  . Neutrophils Relative % 03/27/2016 66  % Final  . Neutro Abs 03/27/2016 6.8  1.7 - 7.7 K/uL Final  . Lymphocytes Relative 03/27/2016 27  % Final  . Lymphs Abs 03/27/2016 2.8  0.7 - 4.0 K/uL Final  . Monocytes Relative 03/27/2016 6  % Final  . Monocytes Absolute 03/27/2016 0.6  0.1 - 1.0 K/uL Final  .  Eosinophils Relative 03/27/2016 1  % Final  . Eosinophils Absolute 03/27/2016 0.1  0.0 - 0.7 K/uL Final  . Basophils Relative 03/27/2016 0  % Final  . Basophils Absolute 03/27/2016 0.0  0.0 - 0.1 K/uL Final  . Sodium 03/27/2016 137  135 - 145 mmol/L Final  . Potassium 03/27/2016 3.7  3.5 - 5.1 mmol/L Final  . Chloride 03/27/2016 101  101 - 111 mmol/L Final  . CO2 03/27/2016 21* 22 - 32 mmol/L Final  . Glucose, Bld 03/27/2016 282* 65 - 99 mg/dL Final  . BUN 03/27/2016 16  6 - 20 mg/dL Final  . Creatinine, Ser 03/27/2016 0.86  0.61 - 1.24 mg/dL Final  . Calcium 03/27/2016 9.8  8.9 - 10.3 mg/dL Final  . Total Protein 03/27/2016 7.7  6.5 - 8.1 g/dL Final  . Albumin 03/27/2016 4.0  3.5 - 5.0 g/dL Final  . AST 03/27/2016 26  15 - 41 U/L Final  . ALT 03/27/2016 29  17 - 63 U/L Final  . Alkaline Phosphatase 03/27/2016 81  38 - 126 U/L Final  . Total Bilirubin 03/27/2016 0.4  0.3 - 1.2 mg/dL Final  . GFR calc non Af Amer 03/27/2016 >60  >60 mL/min Final  . GFR calc Af Amer 03/27/2016 >60  >60 mL/min Final   Comment: (NOTE) The eGFR has been calculated using the CKD EPI equation. This calculation has not been validated in all clinical situations. eGFR's persistently <60 mL/min signify possible Chronic Kidney Disease.   . Anion gap 03/27/2016 15  5 - 15 Final  . Color, Urine 03/27/2016 COLORLESS* YELLOW Final  . APPearance 03/27/2016 CLEAR  CLEAR Final  . Specific Gravity, Urine 03/27/2016 1.025  1.005 - 1.030 Final  . pH 03/27/2016 5.0  5.0 - 8.0 Final  . Glucose, UA 03/27/2016 >=500* NEGATIVE mg/dL Final  . Hgb urine dipstick 03/27/2016 SMALL* NEGATIVE Final  . Bilirubin Urine 03/27/2016 NEGATIVE  NEGATIVE Final  . Ketones, ur 03/27/2016 5* NEGATIVE mg/dL Final  . Protein, ur 03/27/2016 NEGATIVE  NEGATIVE mg/dL Final  . Nitrite 03/27/2016 NEGATIVE  NEGATIVE Final  . Leukocytes, UA 03/27/2016 NEGATIVE  NEGATIVE Final  . RBC / HPF 03/27/2016 0-5  0 - 5 RBC/hpf Final  . WBC, UA 03/27/2016  0-5  0 - 5 WBC/hpf Final  . Bacteria, UA 03/27/2016 NONE SEEN  NONE SEEN Final  . Squamous Epithelial / LPF 03/27/2016 NONE SEEN  NONE SEEN Final  . Glucose-Capillary 03/28/2016 209* 65 - 99 mg/dL Final  . Comment 1 03/28/2016 Notify RN   Final  . Comment 2 03/28/2016 Document in Chart   Final      Allergies as of 04/03/2016      Reactions   Shellfish Allergy Nausea And Vomiting      Medication List       Accurate as of 04/03/16  5:01 PM. Always use your most recent med list.          acetaminophen 325 MG tablet Commonly known as:  TYLENOL Take 2 tablets (650 mg total) by mouth every 6 (six) hours as needed for moderate pain.   amLODipine 10 MG tablet Commonly known as:  NORVASC Take 1 tablet (10 mg total) by mouth daily.   atorvastatin 40 MG tablet Commonly known as:  LIPITOR Take 1 tablet (40 mg total) by mouth daily.   busPIRone 10 MG tablet Commonly known as:  BUSPAR Take 20 mg by mouth 3 (three) times daily.   FARXIGA 10 MG Tabs tablet Generic drug:  dapagliflozin propanediol TAKE 1 TABLET BY MOUTH DAILY.   glucose blood test strip Commonly known as:  TRUE METRIX BLOOD GLUCOSE TEST Use as instructed   hydrochlorothiazide 25 MG tablet Commonly known as:  HYDRODIURIL TAKE ONE TABLET BY MOUTH DAILY   Insulin Glargine 300 UNIT/ML Sopn Commonly known as:  TOUJEO SOLOSTAR Inject 40 Units into the skin daily.   insulin NPH-regular Human (70-30) 100 UNIT/ML injection Commonly known as:  NOVOLIN 70/30 85 minutes before breakfast and 85 before   Insulin Pen Needle 32G X 4 MM Misc Commonly known as:  ULTICARE MICRO PEN NEEDLES Give insulin twice per day for E11.9   INSULIN SYRINGE 1CC/28G 28G X 1/2" 1 ML Misc Used as instructed by PCP   levothyroxine 75 MCG tablet Commonly known as:  SYNTHROID, LEVOTHROID TAKE 1 TABLET BY MOUTH DAILY BEFORE BREAKFAST. MUST HAVE OFFICE VISIT FOR REFILLS.   lisinopril 40 MG tablet Commonly known as:  PRINIVIL,ZESTRIL TAKE  1 TABLET BY MOUTH DAILY.   metFORMIN 500 MG 24 hr tablet Commonly known as:  GLUCOPHAGE-XR TAKE 2 TABLETS BY MOUTH DAILY WITH SUPPER.   multivitamin with minerals Tabs tablet Take 1 tablet by mouth daily.   omega-3 acid ethyl esters 1 g capsule Commonly known as:  LOVAZA Take 2 capsules (2 g total) by mouth 2 (two) times daily. To help lower triglycerides   sertraline 100 MG tablet Commonly known as:  ZOLOFT Take 100 mg by mouth 2 (two) times daily.   traZODone 100 MG tablet Commonly known as:  DESYREL Take 300 mg by mouth at bedtime.   TRUE METRIX METER w/Device Kit USE AS INSTRUCTED   TRUEPLUS LANCETS 28G Misc USE AS INSTRUCTED       Allergies:  Allergies  Allergen Reactions  . Shellfish Allergy Nausea And Vomiting    Past Medical History:  Diagnosis Date  . 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 COPD (chronic obstructive pulmonary disease) ([place])   . Diabetes mellitus without complication ([place])   . Hypertension     Past Surgical History:  Procedure Laterality Date  . LEG SURGERY Right 2004   upper thigh from a spider bite    No family history on file.  Social History:  reports that he has been smoking Cigarettes.  He has never  used smokeless tobacco. He reports that he does not drink alcohol or use drugs.      Review of Systems  He has had hypothyroidism, currently taking 75 g levothyroxine   Lab Results  Component Value Date   TSH 3.40 11/11/2015   TSH 2.588 02/18/2015   TSH 5.725 (H) 05/17/2014   FREET4 0.90 02/18/2015   FREET4 0.93 05/17/2014    HYPERCHOLESTEROLEMIA: He has been given Lipitor 40 mg daily and Lovaza by his PCP Most likely is not taking his Lipitor, has not been given a recent prescription   Lab Results  Component Value Date   CHOL 266 (H) 03/02/2016   HDL 38.30 (L) 03/02/2016   LDLCALC NOT CALC 05/17/2014   LDLDIRECT 155.0 03/02/2016   TRIG (H) 03/02/2016    475.0 Triglyceride is over 400;  calculations on Lipids are invalid.   CHOLHDL 7 03/02/2016    Hypertension: Blood pressure is Out of control.  He supposed to be taking lisinopril 40 mg, amlodipine and HCTZ, followed by PCP. However has not had any follow-up with PCP  Also on Farxiga  BP Readings from Last 3 Encounters:  04/03/16 (!) 148/86  03/27/16 152/88  03/27/16 (!) 144/90     Most recent eye exam was 2/17 with Dr Mendel Ryder  Most recent foot exam: 3/17    Physical Examination:  BP (!) 148/86   Pulse (!) 107   Ht 5' 9"  (1.753 m)   Wt (!) 315 lb (142.9 kg)   SpO2 98%   BMI 46.52 kg/m     ASSESSMENT:  Diabetes type 2, uncontrolled with A1c consistently around 11%   See history of present illness for detailed discussion of current diabetes management, blood sugar patterns and problems identified He is still highly resistant to insulin and requiring large doses It appears that he may be compliant with his insulin as directed by his history However he may not be getting his Wilder Glade consistently Blood sugars are available only for the last few days for review  Since his blood sugars are high fasting also he may benefit from adding a basal insulin to his premixed insulin Ideally he should be on U-500 Regular Insulin but unlikely that he can get this covered because of cost   HYPERTENSION: Poorly controlled and not clear if this is from inadequate compliance  PLAN:   Start taking basal insulin in addition to premixed insulin, he can start with 40 units of Toujeo insulin.   Also given the free co-pay card and patient brochure on this He will follow the instructions for his insulin doses consistently twice a day He needs to check his blood sugars at least twice a day and discussed when to check them Have renewed the prescription for his premixed insulin to reflect the current dosage Will review his blood sugar patterns in another month Consider follow-up with diabetes educator again  Patient  Instructions  Take Toujeo 40 units in am with pen  Stay on 85 of 70/30 before BFST and supper       Jason Hall 04/03/2016, 5:01 PM   Note: This office note was prepared with Estate agent. Any transcriptional errors that result from this process are unintentional.

## 2016-04-03 NOTE — Patient Instructions (Signed)
Take Toujeo 40 units in am with pen  Stay on 85 of 70/30 before BFST and supper

## 2016-04-04 ENCOUNTER — Telehealth: Payer: Self-pay | Admitting: Endocrinology

## 2016-04-04 MED FILL — TRUEPLUS PEN NDL 32GX5/32": 32G X 4 MM | 50 days supply | Qty: 100 | Fill #0

## 2016-04-04 MED FILL — TOUJEO SOLOSTAR 300 UNITS/M: 300 | 34 days supply | Qty: 5 | Fill #0

## 2016-04-04 MED FILL — TRUEPLUS PEN NDL 32GX5/32: 32G X 4 MM | 50 days supply | Qty: 100 | Fill #0

## 2016-04-04 NOTE — Telephone Encounter (Signed)
Need clarification for insulin instruction insulin NPH-regular Human (NOVOLIN 70/30) (70-30) 100   [phone]  [facility]

## 2016-04-10 ENCOUNTER — Ambulatory Visit: Payer: Self-pay | Attending: Family Medicine | Admitting: Family Medicine

## 2016-04-10 ENCOUNTER — Encounter: Payer: Self-pay | Admitting: Family Medicine

## 2016-04-10 VITALS — BP 129/83 | HR 90 | Temp 98.4°F | Resp 18 | Ht 69.0 in | Wt 315.4 lb

## 2016-04-10 DIAGNOSIS — E119 Type 2 diabetes mellitus without complications: Secondary | ICD-10-CM | POA: Insufficient documentation

## 2016-04-10 DIAGNOSIS — F329 Major depressive disorder, single episode, unspecified: Secondary | ICD-10-CM | POA: Insufficient documentation

## 2016-04-10 DIAGNOSIS — E1140 Type 2 diabetes mellitus with diabetic neuropathy, unspecified: Secondary | ICD-10-CM

## 2016-04-10 DIAGNOSIS — Z8659 Personal history of other mental and behavioral disorders: Secondary | ICD-10-CM

## 2016-04-10 DIAGNOSIS — R112 Nausea with vomiting, unspecified: Secondary | ICD-10-CM | POA: Insufficient documentation

## 2016-04-10 DIAGNOSIS — Z794 Long term (current) use of insulin: Secondary | ICD-10-CM | POA: Insufficient documentation

## 2016-04-10 DIAGNOSIS — R110 Nausea: Secondary | ICD-10-CM

## 2016-04-10 LAB — GLUCOSE, POCT (MANUAL RESULT ENTRY): POC Glucose: 198 mg/dl — AB (ref 70–99)

## 2016-04-10 MED ORDER — METOCLOPRAMIDE HCL 10 MG PO TABS: 10.0000 mg | ORAL_TABLET | Freq: Three times a day (TID) | ORAL | 0 refills | Status: AC

## 2016-04-10 MED ORDER — GLUCOSE BLOOD VI STRP: ORAL_STRIP | 12 refills | Status: AC

## 2016-04-10 MED FILL — TRUE METRIX TEST STRIP: 25 days supply | Qty: 100 | Fill #0

## 2016-04-10 MED FILL — METOCLOPRAMIDE 10 MG TABLET: 10 | 30 days supply | Qty: 90 | Fill #0

## 2016-04-10 NOTE — Progress Notes (Signed)
Subjective:  Patient ID: Jason Hall, male    DOB: [birth year]  Age: 56 y.o. MRN: [id number]  CC: Diabetes   HPI Jason Hall presents for diabetes follow up. History of recent ED visit. Recent follow-up with his endocrinologist. Reports compliance with checking blood glucose at home. Denies any polyuria or polydipsia, wounds, or blurred vision. He does report vomiting and nausea. He reports an episode of vomiting on Sunday. Denies any constitutional symptoms or diarrhea. Reports nausea is worse after eating a meal. Reports history of depression. Denies any SI or HI. He reports receiving care from a psychiatrist at [facility].     Outpatient Medications Prior to Visit  Medication Sig Dispense Refill  . acetaminophen (TYLENOL) 325 MG tablet Take 2 tablets (650 mg total) by mouth every 6 (six) hours as needed for moderate pain.    Marland Kitchen amLODipine (NORVASC) 10 MG tablet Take 1 tablet (10 mg total) by mouth daily. 30 tablet 5  . atorvastatin (LIPITOR) 40 MG tablet Take 1 tablet (40 mg total) by mouth daily. 90 tablet 2  . Blood Glucose Monitoring Suppl (TRUE METRIX METER) w/Device KIT USE AS INSTRUCTED 1 kit 0  . busPIRone (BUSPAR) 10 MG tablet Take 20 mg by mouth 3 (three) times daily.    Marland Kitchen FARXIGA 10 MG TABS tablet TAKE 1 TABLET BY MOUTH DAILY. 30 tablet 3  . hydrochlorothiazide (HYDRODIURIL) 25 MG tablet TAKE ONE TABLET BY MOUTH DAILY 30 tablet 2  . Insulin Glargine (TOUJEO SOLOSTAR) 300 UNIT/ML SOPN Inject 40 Units into the skin daily. 6 pen 3  . insulin NPH-regular Human (NOVOLIN 70/30) (70-30) 100 UNIT/ML injection 85 minutes before breakfast and 85 before 90 mL 3  . Insulin Pen Needle (ULTICARE MICRO PEN NEEDLES) 32G X 4 MM MISC Give insulin twice per day for E11.9 100 each 12  . Insulin Syringe-Needle U-100 (INSULIN SYRINGE 1CC/28G) 28G X 1/2" 1 ML MISC Used as instructed by PCP 1 each 11  . levothyroxine (SYNTHROID, LEVOTHROID) 75 MCG tablet TAKE 1 TABLET BY MOUTH DAILY BEFORE  BREAKFAST. MUST HAVE OFFICE VISIT FOR REFILLS. 30 tablet 2  . lisinopril (PRINIVIL,ZESTRIL) 40 MG tablet TAKE 1 TABLET BY MOUTH DAILY. 30 tablet 2  . metFORMIN (GLUCOPHAGE-XR) 500 MG 24 hr tablet TAKE 2 TABLETS BY MOUTH DAILY WITH SUPPER. 60 tablet 3  . Multiple Vitamin (MULTIVITAMIN WITH MINERALS) TABS tablet Take 1 tablet by mouth daily.    Marland Kitchen omega-3 acid ethyl esters (LOVAZA) 1 g capsule Take 2 capsules (2 g total) by mouth 2 (two) times daily. To help lower triglycerides 360 capsule 3  . sertraline (ZOLOFT) 100 MG tablet Take 100 mg by mouth 2 (two) times daily.    . traZODone (DESYREL) 100 MG tablet Take 300 mg by mouth at bedtime.    . TRUEPLUS LANCETS 28G MISC USE AS INSTRUCTED 100 each 5  . glucose blood (TRUE METRIX BLOOD GLUCOSE TEST) test strip Use as instructed 100 each 12   Facility-Administered Medications Prior to Visit  Medication Dose Route Frequency Provider Last Rate Last Dose  . insulin aspart (novoLOG) injection 20 Units  20 Units Subcutaneous Once Alfonse Spruce, FNP        ROS Review of Systems  Eyes: Negative.   Respiratory: Negative.   Cardiovascular: Negative.   Gastrointestinal: Positive for nausea and vomiting.  Skin: Negative.   Psychiatric/Behavioral:       History of depression.     Objective:  BP 129/83 (BP Location: Left Arm, Patient  Position: Sitting, Cuff Size: Normal)   Pulse 90   Temp 98.4 F (36.9 C) (Oral)   Resp 18   Ht _0  (1.753 m)   Wt (!) 315 lb 6.4 oz (143.1 kg)   SpO2 95%   BMI 46.58 kg/m   BP/Weight 04/10/2016 04/03/2016 2/92/4462  Systolic BP 863 817 711  Diastolic BP 83 86 88  Wt. (Lbs) 315.4 315 313  BMI 46.58 46.52 45.56   Physical Exam  Eyes: Conjunctivae are normal. Pupils are equal, round, and reactive to light.  Cardiovascular: Normal rate, regular rhythm, normal heart sounds and intact distal pulses.   Pulmonary/Chest: Effort normal and breath sounds normal.  Abdominal: Soft. Bowel sounds are normal.  Skin:  Skin is warm and dry.  Psychiatric: He expresses no suicidal plans and no homicidal plans.  Nursing note and vitals reviewed.   Diabetic Foot Exam - Simple   Simple Foot Form Diabetic Foot exam was performed with the following findings:  Yes 04/10/2016 12:13 PM  Visual Inspection See comments:  Yes Sensation Testing See comments:  Yes Pulse Check Posterior Tibialis and Dorsalis pulse intact bilaterally:  Yes Comments Elongated toenails. No deformities or ulcerations or skin breakdown present. Decreased sensation to bilateral metatarsal area and heels.    Assessment & Plan:   Problem List Items Addressed This Visit      Endocrine   Diabetes mellitus without complication ([place])   Relevant Orders   Glucose (CBG) (Completed)    Other Visit Diagnoses    Type 2 diabetes mellitus with diabetic neuropathy, with long-term current use of insulin ([place])    -  Primary   Relevant Medications   glucose blood test strip   Other Relevant Orders   Ambulatory referral to Podiatry   Nausea       Relevant Medications   metoCLOPramide (REGLAN) 10 MG tablet   History of depression       -LCSW spoke with patient and provided resources.      Meds ordered this encounter  Medications  . glucose blood test strip    Sig: Use as instructed    Dispense:  100 each    Refill:  12    Order Specific Question:   Supervising Provider    Answer:   Tresa Garter [[id number]]  . metoCLOPramide (REGLAN) 10 MG tablet    Sig: Take 1 tablet (10 mg total) by mouth 3 (three) times daily before meals.    Dispense:  90 tablet    Refill:  0    Order Specific Question:   Supervising Provider    Answer:   Tresa Garter [[id number]]     Alfonse Spruce FNP

## 2016-04-10 NOTE — Patient Instructions (Signed)
Diabetic Neuropathy Diabetic neuropathy is a nerve disease or nerve damage that is caused by diabetes mellitus. About half of all people with diabetes mellitus have some form of nerve damage. Nerve damage is more common in those who have had diabetes mellitus for many years and who generally have not had good control of their blood sugar (glucose) level. Diabetic neuropathy is a common complication of diabetes mellitus. There are three common types of diabetic neuropathy and a fourth type that is less common and less understood:  Peripheral neuropathy-This is the most common type of diabetic neuropathy. It causes damage to the nerves of the feet and legs first and then eventually the hands and arms. The damage affects the ability to sense touch.  Autonomic neuropathy-This type causes damage to the autonomic nervous system, which controls the following functions: ? Heartbeat. ? Body temperature. ? Blood pressure. ? Urination. ? Digestion. ? Sweating. ? Sexual function.  Focal neuropathy-Focal neuropathy can be painful and unpredictable and occurs most often in older adults with diabetes mellitus. It involves a specific nerve or one area and often comes on suddenly. It usually does not cause long-term problems.  Radiculoplexus neuropathy- Sometimes called lumbosacral radiculoplexus neuropathy, radiculoplexus neuropathy affects the nerves of the thighs, hips, buttocks, or legs. It is more common in people with type 2 diabetes mellitus and in older men. It is characterized by debilitating pain, weakness, and atrophy, usually in the thigh muscles.  What are the causes? The cause of peripheral, autonomic, and focal neuropathies is diabetes mellitus that is uncontrolled and high glucose levels. The cause of radiculoplexus neuropathy is unknown. However, it is thought to be caused by inflammation related to uncontrolled glucose levels. What are the signs or symptoms? Peripheral Neuropathy Peripheral  neuropathy develops slowly over time. When the nerves of the feet and legs no longer work there may be:  Burning, stabbing, or aching pain in the legs or feet.  Inability to feel pressure or pain in your feet. This can lead to: ? Thick calluses over pressure areas. ? Pressure sores. ? Ulcers.  Foot deformities.  Reduced ability to feel temperature changes.  Muscle weakness.  Autonomic Neuropathy The symptoms of autonomic neuropathy vary depending on which nerves are affected. Symptoms may include:  Problems with digestion, such as: ? Feeling sick to your stomach (nausea). ? Vomiting. ? Bloating. ? Constipation. ? Diarrhea. ? Abdominal pain.  Difficulty with urination. This occurs if you lose your ability to sense when your bladder is full. Problems include: ? Urine leakage (incontinence). ? Inability to empty your bladder completely (retention).  Rapid or irregular heartbeat (palpitations).  Blood pressure drops when you stand up (orthostatic hypotension). When you stand up you may feel: ? Dizzy. ? Weak. ? Faint.  In men, inability to attain and maintain an erection.  In women, vaginal dryness and problems with decreased sexual desire and arousal.  Problems with body temperature regulation.  Increased or decreased sweating.  Focal Neuropathy  Abnormal eye movements or abnormal alignment of both eyes.  Weakness in the wrist.  Foot drop. This results in an inability to lift the foot properly and abnormal walking or foot movement.  Paralysis on one side of your face (Bell palsy).  Chest or abdominal pain. Radiculoplexus Neuropathy  Sudden, severe pain in your hip, thigh, or buttocks.  Weakness and wasting of thigh muscles.  Difficulty rising from a seated position.  Abdominal swelling.  Unexplained weight loss (usually more than 10 lb [4.5 kg]). How is   this diagnosed? Peripheral Neuropathy Your senses may be tested. Sensory function testing can be  done with:  A light touch using a monofilament.  A vibration with tuning fork.  A sharp sensation with a pin prick.  Other tests that can help diagnose neuropathy are:  Nerve conduction velocity. This test checks the transmission of an electrical current through a nerve.  Electromyography. This shows how muscles respond to electrical signals transmitted by nearby nerves.  Quantitative sensory testing. This is used to assess how your nerves respond to vibrations and changes in temperature.  Autonomic Neuropathy Diagnosis is often based on reported symptoms. Tell your health care provider if you experience:  Dizziness.  Constipation.  Diarrhea.  Inappropriate urination or inability to urinate.  Inability to get or maintain an erection.  Tests that may be done include:  Electrocardiography or Holter monitor. These are tests that can help show problems with the heart rate or heart rhythm.  An X-ray exam may be done.  Focal Neuropathy Diagnosis is made based on your symptoms and what your health care provider finds during your exam. Other tests may be done. They may include:  Nerve conduction velocities. This checks the transmission of electrical current through a nerve.  Electromyography. This shows how muscles respond to electrical signals transmitted by nearby nerves.  Quantitative sensory testing. This test is used to assess how your nerves respond to vibration and changes in temperature.  Radiculoplexus Neuropathy  Often the first thing is to eliminate any other issue or problems that might be the cause, as there is no standard test for diagnosis.  X-ray exam of your spine and lumbar region.  Spinal tap to rule out cancer.  MRI to rule out other lesions. How is this treated? Once nerve damage occurs, it cannot be reversed. The goal of treatment is to keep the disease or nerve damage from getting worse and affecting more nerve fibers. Controlling your blood  glucose level is the key. Most people with radiculoplexus neuropathy see at least a partial improvement over time. You will need to keep your blood glucose and HbA1c levels in the target range determined by your health care provider. Things that help control blood glucose levels include:  Blood glucose monitoring.  Meal planning.  Physical activity.  Diabetes medicine.  Over time, maintaining lower blood glucose levels helps lessen symptoms. Sometimes, prescription pain medicine is needed. Follow these instructions at home:  Do not smoke.  Keep your blood glucose level in the range that you and your health care provider have determined acceptable for you.  Keep your blood pressure level in the range that you and your health care provider have determined acceptable for you.  Eat a well-balanced diet.  Be physically active every day. Include strength training and balance exercises.  Protect your feet. ? Check your feet every day for sores, cuts, blisters, or signs of infection. ? Wear padded socks and supportive shoes. Use orthotic inserts, if necessary. ? Regularly check the insides of your shoes for worn spots. Make sure there are no rocks or other items inside your shoes before you put them on. Contact a health care provider if:  You have burning, stabbing, or aching pain in the legs or feet.  You are unable to feel pressure or pain in your feet.  You develop problems with digestion such as: ? Nausea. ? Vomiting. ? Bloating. ? Constipation. ? Diarrhea. ? Abdominal pain.  You have difficulty with urination, such as: ? Incontinence. ? Retention.    You have palpitations.  You develop orthostatic hypotension. When you stand up you may feel: ? Dizzy. ? Weak. ? Faint.  You cannot attain and maintain an erection (in men).  You have vaginal dryness and problems with decreased sexual desire and arousal (in women).  You have severe pain in your thighs, legs, or  buttocks.  You have unexplained weight loss. This information is not intended to replace advice given to you by your health care provider. Make sure you discuss any questions you have with your health care provider. Document Released: 04/30/2001 Document Revised: 07/28/2015 Document Reviewed: 07/31/2012 Elsevier Interactive Patient Education  2017 [employer]  

## 2016-04-13 ENCOUNTER — Ambulatory Visit: Payer: Self-pay | Attending: Family Medicine

## 2016-04-16 MED FILL — !NOVOLIN 70/30 100 UNITS/ML: (70-30) 100 | 22 days supply | Qty: 40 | Fill #0

## 2016-04-16 NOTE — Telephone Encounter (Signed)
I contacted [facility] and gave verbal clarification on novolin 70/30 dosage.

## 2016-04-20 ENCOUNTER — Other Ambulatory Visit: Payer: Self-pay | Admitting: *Deleted

## 2016-04-20 MED ORDER — INSULIN GLARGINE 300 UNIT/ML ~~LOC~~ SOPN: 40.0000 [IU] | PEN_INJECTOR | Freq: Every day | SUBCUTANEOUS | 3 refills | Status: DC

## 2016-04-20 NOTE — Telephone Encounter (Signed)
PRINTED FOR PASS PROGRAM 

## 2016-04-23 MED FILL — traZODone HCL 100 MG TABS: 100 | 30 days supply | Qty: 30 | Fill #1

## 2016-04-23 MED FILL — ?BUPROPION HCL XL 300 MG TA: 300 | 30 days supply | Qty: 30 | Fill #2

## 2016-04-23 MED FILL — ?SERTRALINE HCL 100 MG TAB: 100 | 30 days supply | Qty: 60 | Fill #1

## 2016-04-23 MED FILL — AMLODIPINE BESYLATE 10 MG T: 10 | 30 days supply | Qty: 30 | Fill #2 | Status: TO

## 2016-04-23 MED FILL — LEVOTHYROXINE 75 MCG TABLET: 75 | 30 days supply | Qty: 30 | Fill #1

## 2016-04-23 MED FILL — HYDROCHLOROTHIAZIDE 25 MG T: 25 | 30 days supply | Qty: 30 | Fill #1

## 2016-04-23 MED FILL — LISINOPRIL 40 MG TABLET: 40 | 30 days supply | Qty: 30 | Fill #1

## 2016-04-23 MED FILL — ATORVASTATIN 40 MG TABLET: 40 | 30 days supply | Qty: 30 | Fill #2

## 2016-04-23 MED FILL — busPIRone HCL 10 MG TABS: 10 | 30 days supply | Qty: 180 | Fill #1

## 2016-04-26 ENCOUNTER — Other Ambulatory Visit: Payer: Self-pay | Admitting: *Deleted

## 2016-04-26 NOTE — Telephone Encounter (Signed)
MA spoke with Judeth CornfieldStephanie, a nurse at Dr. Remus BlakeKumar's office. Judeth CornfieldStephanie clarified the dosing to be 85 units before breakfast and 85 units before dinner.

## 2016-04-30 ENCOUNTER — Encounter: Payer: Self-pay | Admitting: Family Medicine

## 2016-05-01 ENCOUNTER — Ambulatory Visit: Payer: Self-pay | Admitting: Endocrinology

## 2016-05-08 MED FILL — $TOUJEO SOLOSTAR 300 UNIT/M: 300 | 22 days supply | Qty: 2 | Fill #1

## 2016-05-11 ENCOUNTER — Ambulatory Visit: Payer: Medicaid Other | Attending: Family Medicine | Admitting: Family Medicine

## 2016-05-11 ENCOUNTER — Encounter: Payer: Self-pay | Admitting: Family Medicine

## 2016-05-11 VITALS — BP 157/85 | HR 79 | Temp 98.1°F | Resp 18 | Ht 70.0 in | Wt 318.6 lb

## 2016-05-11 DIAGNOSIS — E119 Type 2 diabetes mellitus without complications: Secondary | ICD-10-CM | POA: Diagnosis not present

## 2016-05-11 DIAGNOSIS — I10 Essential (primary) hypertension: Secondary | ICD-10-CM | POA: Insufficient documentation

## 2016-05-11 DIAGNOSIS — Z794 Long term (current) use of insulin: Secondary | ICD-10-CM | POA: Diagnosis not present

## 2016-05-11 DIAGNOSIS — Z0000 Encounter for general adult medical examination without abnormal findings: Secondary | ICD-10-CM

## 2016-05-11 DIAGNOSIS — E1165 Type 2 diabetes mellitus with hyperglycemia: Secondary | ICD-10-CM | POA: Insufficient documentation

## 2016-05-11 DIAGNOSIS — J449 Chronic obstructive pulmonary disease, unspecified: Secondary | ICD-10-CM | POA: Diagnosis not present

## 2016-05-11 DIAGNOSIS — F17210 Nicotine dependence, cigarettes, uncomplicated: Secondary | ICD-10-CM | POA: Diagnosis not present

## 2016-05-11 LAB — POCT URINALYSIS DIPSTICK
BILIRUBIN UA: NEGATIVE
Blood, UA: NEGATIVE
Glucose, UA: 500
LEUKOCYTES UA: NEGATIVE
NITRITE UA: NEGATIVE
PROTEIN UA: 30
Spec Grav, UA: 1.015
Urobilinogen, UA: 0.2
pH, UA: 6.5

## 2016-05-11 LAB — GLUCOSE, POCT (MANUAL RESULT ENTRY)
POC GLUCOSE: 300 mg/dL — AB (ref 70–99)
POC Glucose: 271 mg/dl — AB (ref 70–99)

## 2016-05-11 MED ORDER — HYDROCHLOROTHIAZIDE 25 MG PO TABS: 25.0000 mg | ORAL_TABLET | Freq: Every day | ORAL | 2 refills | Status: DC

## 2016-05-11 MED ORDER — HYDROCHLOROTHIAZIDE 50 MG PO TABS: 50.0000 mg | ORAL_TABLET | Freq: Every day | ORAL | 2 refills | Status: AC

## 2016-05-11 MED ORDER — METFORMIN HCL ER 500 MG PO TB24: ORAL_TABLET | ORAL | 3 refills | Status: AC

## 2016-05-11 MED ORDER — TRUEPLUS LANCETS 28G MISC: 5 refills | Status: AC

## 2016-05-11 MED ORDER — INSULIN ASPART 100 UNIT/ML ~~LOC~~ SOLN
10.0000 [IU] | Freq: Once | SUBCUTANEOUS | Status: AC
  Administered 2016-05-11: 10 [IU] via SUBCUTANEOUS

## 2016-05-11 MED ORDER — TRUE METRIX METER W/DEVICE KIT: 1.0000 | PACK | Freq: Once | 0 refills | Status: AC

## 2016-05-11 MED ORDER — LISINOPRIL 40 MG PO TABS: 40.0000 mg | ORAL_TABLET | Freq: Every day | ORAL | 2 refills | Status: AC

## 2016-05-11 MED FILL — TRUEplus LANCETS 28G MISC: 30 days supply | Qty: 100 | Fill #0

## 2016-05-11 MED FILL — LISINOPRIL 40 MG TABLET: 40 | 30 days supply | Qty: 30 | Fill #0

## 2016-05-11 MED FILL — METFORMIN HCL ER 500 MG TAB: 500 | 30 days supply | Qty: 60 | Fill #0 | Status: TO

## 2016-05-11 NOTE — Progress Notes (Signed)
Subjective:   Patient ID: Jason Hall, male    DOB: Apr 25, 1960, 56 y.o.   MRN: [id number]  Chief Complaint  Patient presents with  . Annual Exam   HPI Jason Hall 56 y.o. male presents with   Annual physical examination: History of medication non-adherence. DM uncontrolled. He denies any blurred vision, abdominal pain or wounds.He is followed by a endocrinologist for his diabetes. 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 He wears corrective lenses. HTN: History of medication non-adherence. Denies any CP, SOB, or swelling of the BLE. He is a current smoker. He denies any alcohol use. History of depression. He denies any SI/HI. Reports being managed by an outpatient clinic. Family history: Reports father had cancer, unsure of what type. MGM had diabetes and HTN. Denies symptoms of all other pertinent systems.    Past Medical History:  Diagnosis Date  . 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 COPD (chronic obstructive pulmonary disease) ([place])   . Diabetes mellitus without complication ([place])   . Hypertension     Past Surgical History:  Procedure Laterality Date  . LEG SURGERY Right 2004   upper thigh from a spider bite    No family history on file.  Social History   Social History  . Marital status: Single    Spouse name: N/A  . Number of children: N/A  . Years of education: N/A   Occupational History  . Not on file.   Social History Main Topics  . Smoking status: Light Tobacco Smoker    Types: Cigarettes  . Smokeless tobacco: Never Used     Comment: smoking 1 cig per month  . Alcohol use No  . Drug use: No  . Sexual activity: Not on file   Other Topics Concern  . Not on file   Social History Narrative  . No narrative on file    Outpatient Medications Prior to Visit  Medication Sig Dispense Refill  . acetaminophen (TYLENOL) 325 MG tablet Take 2 tablets (650 mg total) by mouth every 6 (six) hours as needed for  moderate pain.    Marland Kitchen amLODipine (NORVASC) 10 MG tablet Take 1 tablet (10 mg total) by mouth daily. 30 tablet 5  . Blood Glucose Monitoring Suppl (TRUE METRIX METER) w/Device KIT USE AS INSTRUCTED 1 kit 0  . busPIRone (BUSPAR) 10 MG tablet Take 20 mg by mouth 3 (three) times daily.    Marland Kitchen FARXIGA 10 MG TABS tablet TAKE 1 TABLET BY MOUTH DAILY. 30 tablet 3  . glucose blood test strip Use as instructed 100 each 12  . Insulin Glargine (TOUJEO SOLOSTAR) 300 UNIT/ML SOPN Inject 40 Units into the skin daily. 9 pen 3  . insulin NPH-regular Human (NOVOLIN 70/30) (70-30) 100 UNIT/ML injection 85 minutes before breakfast and 85 before 90 mL 3  . Insulin Pen Needle (ULTICARE MICRO PEN NEEDLES) 32G X 4 MM MISC Give insulin twice per day for E11.9 100 each 12  . Insulin Syringe-Needle U-100 (INSULIN SYRINGE 1CC/28G) 28G X 1/2" 1 ML MISC Used as instructed by PCP 1 each 11  . levothyroxine (SYNTHROID, LEVOTHROID) 75 MCG tablet TAKE 1 TABLET BY MOUTH DAILY BEFORE BREAKFAST. MUST HAVE OFFICE VISIT FOR REFILLS. 30 tablet 2  . metoCLOPramide (REGLAN) 10 MG tablet Take 1 tablet (10 mg total) by mouth 3 (three) times daily before meals. 90 tablet 0  . Multiple Vitamin (MULTIVITAMIN WITH MINERALS) TABS tablet Take 1 tablet by mouth daily.    Marland Kitchen  omega-3 acid ethyl esters (LOVAZA) 1 g capsule Take 2 capsules (2 g total) by mouth 2 (two) times daily. To help lower triglycerides 360 capsule 3  . sertraline (ZOLOFT) 100 MG tablet Take 100 mg by mouth 2 (two) times daily.    . traZODone (DESYREL) 100 MG tablet Take 300 mg by mouth at bedtime.    Marland Kitchen atorvastatin (LIPITOR) 40 MG tablet Take 1 tablet (40 mg total) by mouth daily. 90 tablet 2  . hydrochlorothiazide (HYDRODIURIL) 25 MG tablet TAKE ONE TABLET BY MOUTH DAILY 30 tablet 2  . lisinopril (PRINIVIL,ZESTRIL) 40 MG tablet TAKE 1 TABLET BY MOUTH DAILY. 30 tablet 2  . metFORMIN (GLUCOPHAGE-XR) 500 MG 24 hr tablet TAKE 2 TABLETS BY MOUTH DAILY WITH SUPPER. 60 tablet 3  .  TRUEPLUS LANCETS 28G MISC USE AS INSTRUCTED 100 each 5   Facility-Administered Medications Prior to Visit  Medication Dose Route Frequency Provider Last Rate Last Dose  . insulin aspart (novoLOG) injection 20 Units  20 Units Subcutaneous Once Alfonse Spruce, FNP        Allergies  Allergen Reactions  . Shellfish Allergy Nausea And Vomiting    Review of Systems  Constitutional: Negative.   HENT: Negative.   Eyes: Negative.   Respiratory: Negative.   Cardiovascular: Negative.   Gastrointestinal: Negative.   Genitourinary: Negative.   Musculoskeletal: Negative.   Skin: Negative.   Neurological: Negative.   Endo/Heme/Allergies: Negative.   Psychiatric/Behavioral: Negative.       Objective:    Physical Exam  Constitutional: He is oriented to person, place, and time. He appears well-developed and well-nourished.  HENT:  Head: Normocephalic and atraumatic.  Right Ear: External ear normal.  Left Ear: External ear normal.  Nose: Nose normal.  Mouth/Throat: Oropharynx is clear and moist.  Eyes: wears corrective lenses.  Eyes: Conjunctivae and EOM are normal. Pupils are equal, round, and reactive to light.  Neck: Normal range of motion. Neck supple.  Cardiovascular: Normal rate, regular rhythm, normal heart sounds and intact distal pulses.   Pulmonary/Chest: Effort normal and breath sounds normal.  Abdominal: Soft. Bowel sounds are normal.  Musculoskeletal: Normal range of motion.  Neurological: He is alert and oriented to person, place, and time. He has normal reflexes.  Skin: Skin is warm and dry.  Psychiatric: His speech is normal. His affect is labile. He expresses no homicidal and no suicidal ideation. He expresses no suicidal plans and no homicidal plans.  Nursing note and vitals reviewed.   BP (!) 157/85 (BP Location: Left Arm, Patient Position: Sitting, Cuff Size: Normal)   Pulse 79   Temp 98.1 F (36.7 C) (Oral)   Resp 18   Ht 5' 10"  (1.778 m)   Wt (!) 318 lb  9.6 oz (144.5 kg)   SpO2 97%   BMI 45.71 kg/m  Wt Readings from Last 3 Encounters:  05/11/16 (!) 318 lb 9.6 oz (144.5 kg)  04/10/16 (!) 315 lb 6.4 oz (143.1 kg)  04/03/16 (!) 315 lb (142.9 kg)    Immunization History  Administered Date(s) Administered  . Influenza,inj,Quad PF,36+ Mos 02/18/2015, 12/15/2015  . Pneumococcal Polysaccharide-23 09/24/2013    Diabetic Foot Exam - Simple   Simple Foot Form Diabetic Foot exam was performed with the following findings:  Yes 05/11/2016  3:49 PM  Visual Inspection No deformities, no ulcerations, no other skin breakdown bilaterally:  Yes Sensation Testing Intact to touch and monofilament testing bilaterally:  Yes Pulse Check Posterior Tibialis and Dorsalis pulse intact bilaterally:  Yes Comments Elongated toenails to bilateral feet with thickened nails.     Depression screen [facility] 2/9 05/11/2016 04/10/2016 03/27/2016  Decreased Interest 3 3 3   Down, Depressed, Hopeless 3 3 3   PHQ - 2 Score 6 6 6   Altered sleeping 2 2 2   Tired, decreased energy 3 3 3   Change in appetite 3 3 2   Feeling bad or failure about yourself  3 3 2   Trouble concentrating 0 2 1  Moving slowly or fidgety/restless 0 0 1  Suicidal thoughts 1 0 0  PHQ-9 Score 18 19 17    GAD 7 : Generalized Anxiety Score 05/11/2016 04/10/2016 03/27/2016  Nervous, Anxious, on Edge 3 3 2   Control/stop worrying 3 2 1   Worry too much - different things 3 2 1   Trouble relaxing 0 2 3  Restless 0 0 0  Easily annoyed or irritable 3 3 3   Afraid - awful might happen 0 0 0  Total GAD 7 Score 12 12 10     Lab Results  Component Value Date   TSH 1.30 05/21/2016   Lab Results  Component Value Date   WBC 8.0 05/21/2016   HGB 17.2 (H) 05/21/2016   HCT 52.6 (H) 05/21/2016   MCV 89.0 05/21/2016   PLT 353 05/21/2016   Lab Results  Component Value Date   NA 136 05/21/2016   K 4.3 05/21/2016   CO2 23 05/21/2016   GLUCOSE 359 (H) 05/21/2016   BUN 18 05/21/2016   CREATININE 0.73 05/21/2016    BILITOT 0.5 05/21/2016   ALKPHOS 74 05/21/2016   AST 16 05/21/2016   ALT 21 05/21/2016   PROT 7.6 05/21/2016   ALBUMIN 4.2 05/21/2016   CALCIUM 10.4 (H) 05/21/2016   ANIONGAP 15 03/27/2016   GFR 160.58 03/02/2016   Lab Results  Component Value Date   CHOL 235 (H) 05/21/2016   CHOL 266 (H) 03/02/2016   CHOL 159 12/23/2015   Lab Results  Component Value Date   HDL 38 (L) 05/21/2016   HDL 38.30 (L) 03/02/2016   HDL 37.30 (L) 12/23/2015   Lab Results  Component Value Date   LDLCALC 123 (H) 05/21/2016   LDLCALC NOT CALC 05/17/2014   LDLCALC NOT CALC 02/08/2014   Lab Results  Component Value Date   TRIG 369 (H) 05/21/2016   TRIG (H) 03/02/2016    475.0 Triglyceride is over 400; calculations on Lipids are invalid.   TRIG (H) 12/23/2015    446.0 Triglyceride is over 400; calculations on Lipids are invalid.   Lab Results  Component Value Date   CHOLHDL 6.2 (H) 05/21/2016   CHOLHDL 7 03/02/2016   CHOLHDL 4 12/23/2015   Lab Results  Component Value Date   HGBA1C 10.1 (H) 03/02/2016   HGBA1C 10.8 (H) 11/11/2015   HGBA1C 10.5 (H) 08/18/2015       Assessment & Plan:   Problem List Items Addressed This Visit      Cardiovascular and Mediastinum   Hypertension   Relevant Medications   lisinopril (PRINIVIL,ZESTRIL) 40 MG tablet   hydrochlorothiazide (HYDRODIURIL) 50 MG tablet     Endocrine   Diabetes mellitus without complication (HCC)   Relevant Medications   insulin aspart (novoLOG) injection 10 Units (Completed)   metFORMIN (GLUCOPHAGE-XR) 500 MG 24 hr tablet   lisinopril (PRINIVIL,ZESTRIL) 40 MG tablet   TRUEPLUS LANCETS 28G MISC   Other Relevant Orders   Glucose (CBG) (Completed)   Urinalysis Dipstick (Completed)   Glucose (CBG) (Completed)    Other Visit  Diagnoses    Annual physical exam    -  Primary   Relevant Medications   metFORMIN (GLUCOPHAGE-XR) 500 MG 24 hr tablet   lisinopril (PRINIVIL,ZESTRIL) 40 MG tablet   TRUEPLUS LANCETS 28G MISC   Other  Relevant Orders   TSH (Completed)   Lipid Panel (Completed)   COMPLETE METABOLIC PANEL WITH GFR (Completed)   CBC with Differential (Completed)   Vitamin D, 25-hydroxy (Completed)      Meds ordered this encounter  Medications  . insulin aspart (novoLOG) injection 10 Units  . metFORMIN (GLUCOPHAGE-XR) 500 MG 24 hr tablet    Sig: TAKE 2 TABLETS BY MOUTH DAILY WITH SUPPER.    Dispense:  60 tablet    Refill:  3    Order Specific Question:   Supervising Provider    Answer:   Tresa Garter [id number]  . lisinopril (PRINIVIL,ZESTRIL) 40 MG tablet    Sig: Take 1 tablet (40 mg total) by mouth daily.    Dispense:  30 tablet    Refill:  2    Order Specific Question:   Supervising Provider    Answer:   Tresa Garter [id number]  . DISCONTD: hydrochlorothiazide (HYDRODIURIL) 25 MG tablet    Sig: Take 1 tablet (25 mg total) by mouth daily.    Dispense:  30 tablet    Refill:  2    Order Specific Question:   Supervising Provider    Answer:   Tresa Garter [id number]  . TRUEPLUS LANCETS 28G MISC    Sig: USE AS INSTRUCTED    Dispense:  100 each    Refill:  5    Order Specific Question:   Supervising Provider    Answer:   Tresa Garter [id number]  . Blood Glucose Monitoring Suppl (TRUE METRIX METER) w/Device KIT    Sig: 1 Device by Does not apply route once.    Dispense:  1 kit    Refill:  0    Order Specific Question:   Supervising Provider    Answer:   Tresa Garter [id number]  . hydrochlorothiazide (HYDRODIURIL) 50 MG tablet    Sig: Take 1 tablet (50 mg total) by mouth daily.    Dispense:  30 tablet    Refill:  2    Order Specific Question:   Supervising Provider    Answer:   Tresa Garter [id number]     Follow up: Return in about 1 month (around 06/11/2016) for Diabetes & HTN.   Fredia Beets, FNP

## 2016-05-11 NOTE — Patient Instructions (Addendum)
Follow up in 1 month for Diabetes.  Hypertension Hypertension is another name for high blood pressure. High blood pressure forces your heart to work harder to pump blood. This can cause problems over time. There are two numbers in a blood pressure reading. There is a top number (systolic) over a bottom number (diastolic). It is best to have a blood pressure below 120/80. Healthy choices can help lower your blood pressure. You may need medicine to help lower your blood pressure if:  Your blood pressure cannot be lowered with healthy choices.  Your blood pressure is higher than 130/80. Follow these instructions at home: Eating and drinking   If directed, follow the DASH eating plan. This diet includes:  Filling half of your plate at each meal with fruits and vegetables.  Filling one quarter of your plate at each meal with whole grains. Whole grains include whole wheat pasta, brown rice, and whole grain bread.  Eating or drinking low-fat dairy products, such as skim milk or low-fat yogurt.  Filling one quarter of your plate at each meal with low-fat (lean) proteins. Low-fat proteins include fish, skinless chicken, eggs, beans, and tofu.  Avoiding fatty meat, cured and processed meat, or chicken with skin.  Avoiding premade or processed food.  Eat less than 1,500 mg of salt (sodium) a day.  Limit alcohol use to no more than 1 drink a day for nonpregnant women and 2 drinks a day for men. One drink equals 12 oz of beer, 5 oz of wine, or 1 oz of hard liquor. Lifestyle   Work with your doctor to stay at a healthy weight or to lose weight. Ask your doctor what the best weight is for you.  Get at least 30 minutes of exercise that causes your heart to beat faster (aerobic exercise) most days of the week. This may include walking, swimming, or biking.  Get at least 30 minutes of exercise that strengthens your muscles (resistance exercise) at least 3 days a week. This may include lifting  weights or pilates.  Do not use any products that contain nicotine or tobacco. This includes cigarettes and e-cigarettes. If you need help quitting, ask your doctor.  Check your blood pressure at home as told by your doctor.  Keep all follow-up visits as told by your doctor. This is important. Medicines   Take over-the-counter and prescription medicines only as told by your doctor. Follow directions carefully.  Do not skip doses of blood pressure medicine. The medicine does not work as well if you skip doses. Skipping doses also puts you at risk for problems.  Ask your doctor about side effects or reactions to medicines that you should watch for. Contact a doctor if:  You think you are having a reaction to the medicine you are taking.  You have headaches that keep coming back (recurring).  You feel dizzy.  You have swelling in your ankles.  You have trouble with your vision. Get help right away if:  You get a very bad headache.  You start to feel confused.  You feel weak or numb.  You feel faint.  You get very bad pain in your:  Chest.  Belly (abdomen).  You throw up (vomit) more than once.  You have trouble breathing. Summary  Hypertension is another name for high blood pressure.  Making healthy choices can help lower blood pressure. If your blood pressure cannot be controlled with healthy choices, you may need to take medicine. This information is not intended  to replace advice given to you by your health care provider. Make sure you discuss any questions you have with your health care provider. Document Released: 08/08/2007 Document Revised: 01/18/2016 Document Reviewed: 01/18/2016 Elsevier Interactive Patient Education  2017 [employer]

## 2016-05-11 NOTE — Progress Notes (Signed)
Patient is here for physical  Patient denies pain today

## 2016-05-14 ENCOUNTER — Other Ambulatory Visit: Payer: Medicaid Other

## 2016-05-14 MED FILL — TRUE METRIX BLOOD GLUCOSE M: W/DEVICE | 30 days supply | Qty: 1 | Fill #0

## 2016-05-14 MED FILL — HYDROCHLOROTHIAZIDE 25 MG T: 25 | 30 days supply | Qty: 60 | Fill #0 | Status: TO

## 2016-05-16 ENCOUNTER — Ambulatory Visit: Payer: Self-pay | Admitting: Podiatry

## 2016-05-17 ENCOUNTER — Telehealth: Payer: Self-pay | Admitting: Family Medicine

## 2016-05-17 ENCOUNTER — Other Ambulatory Visit: Payer: Self-pay

## 2016-05-17 NOTE — Telephone Encounter (Signed)
Patient came in and said he is seeing another Endo physician.

## 2016-05-17 NOTE — Telephone Encounter (Signed)
We will consider himself dismissed

## 2016-05-17 NOTE — Telephone Encounter (Signed)
noted 

## 2016-05-17 NOTE — Telephone Encounter (Signed)
Patient came by the office to drop off documents to have PCP filled out for his motor vehicle license. [facility] [employer]. Please follow up.  Thank you.

## 2016-05-18 ENCOUNTER — Other Ambulatory Visit: Payer: Self-pay | Admitting: Family Medicine

## 2016-05-18 DIAGNOSIS — L602 Onychogryphosis: Secondary | ICD-10-CM

## 2016-05-18 DIAGNOSIS — E119 Type 2 diabetes mellitus without complications: Secondary | ICD-10-CM

## 2016-05-21 ENCOUNTER — Ambulatory Visit: Payer: Medicaid Other | Attending: Family Medicine

## 2016-05-21 DIAGNOSIS — Z0000 Encounter for general adult medical examination without abnormal findings: Secondary | ICD-10-CM | POA: Diagnosis present

## 2016-05-21 LAB — CBC WITH DIFFERENTIAL/PLATELET
BASOS PCT: 0 %
Basophils Absolute: 0 cells/uL (ref 0–200)
EOS ABS: 80 {cells}/uL (ref 15–500)
EOS PCT: 1 %
HCT: 52.6 % — ABNORMAL HIGH (ref 38.5–50.0)
HEMOGLOBIN: 17.2 g/dL — AB (ref 13.2–17.1)
LYMPHS ABS: 1520 {cells}/uL (ref 850–3900)
Lymphocytes Relative: 19 %
MCH: 29.1 pg (ref 27.0–33.0)
MCHC: 32.7 g/dL (ref 32.0–36.0)
MCV: 89 fL (ref 80.0–100.0)
MONOS PCT: 9 %
MPV: 10.9 fL (ref 7.5–12.5)
Monocytes Absolute: 720 cells/uL (ref 200–950)
Neutro Abs: 5680 cells/uL (ref 1500–7800)
Neutrophils Relative %: 71 %
PLATELETS: 353 10*3/uL (ref 140–400)
RBC: 5.91 MIL/uL — ABNORMAL HIGH (ref 4.20–5.80)
RDW: 14.4 % (ref 11.0–15.0)
WBC: 8 10*3/uL (ref 3.8–10.8)

## 2016-05-21 LAB — COMPLETE METABOLIC PANEL WITH GFR
ALT: 21 U/L (ref 9–46)
AST: 16 U/L (ref 10–35)
Albumin: 4.2 g/dL (ref 3.6–5.1)
Alkaline Phosphatase: 74 U/L (ref 40–115)
BUN: 18 mg/dL (ref 7–25)
CHLORIDE: 97 mmol/L — AB (ref 98–110)
CO2: 23 mmol/L (ref 20–31)
CREATININE: 0.73 mg/dL (ref 0.70–1.33)
Calcium: 10.4 mg/dL — ABNORMAL HIGH (ref 8.6–10.3)
Glucose, Bld: 359 mg/dL — ABNORMAL HIGH (ref 65–99)
POTASSIUM: 4.3 mmol/L (ref 3.5–5.3)
Sodium: 136 mmol/L (ref 135–146)
Total Bilirubin: 0.5 mg/dL (ref 0.2–1.2)
Total Protein: 7.6 g/dL (ref 6.1–8.1)

## 2016-05-21 LAB — LIPID PANEL
CHOL/HDL RATIO: 6.2 ratio — AB (ref <5.0)
CHOLESTEROL: 235 mg/dL — AB (ref <200)
HDL: 38 mg/dL — AB (ref >40)
LDL Cholesterol: 123 mg/dL — ABNORMAL HIGH (ref <100)
TRIGLYCERIDES: 369 mg/dL — AB (ref <150)
VLDL: 74 mg/dL — ABNORMAL HIGH (ref <30)

## 2016-05-21 LAB — TSH: TSH: 1.3 mIU/L (ref 0.40–4.50)

## 2016-05-21 NOTE — Progress Notes (Signed)
Patient here for lab visit only 

## 2016-05-22 ENCOUNTER — Other Ambulatory Visit: Payer: Self-pay | Admitting: Family Medicine

## 2016-05-22 DIAGNOSIS — E559 Vitamin D deficiency, unspecified: Secondary | ICD-10-CM

## 2016-05-22 DIAGNOSIS — E782 Mixed hyperlipidemia: Secondary | ICD-10-CM

## 2016-05-22 DIAGNOSIS — E7800 Pure hypercholesterolemia, unspecified: Secondary | ICD-10-CM

## 2016-05-22 LAB — VITAMIN D 25 HYDROXY (VIT D DEFICIENCY, FRACTURES): VIT D 25 HYDROXY: 13 ng/mL — AB (ref 30–100)

## 2016-05-22 MED ORDER — VITAMIN D (ERGOCALCIFEROL) 1.25 MG (50000 UNIT) PO CAPS: 50000.0000 [IU] | ORAL_CAPSULE | ORAL | 0 refills | Status: DC

## 2016-05-22 MED ORDER — ATORVASTATIN CALCIUM 80 MG PO TABS: 80.0000 mg | ORAL_TABLET | Freq: Every day | ORAL | 0 refills | Status: AC

## 2016-05-23 ENCOUNTER — Telehealth: Payer: Self-pay

## 2016-05-23 NOTE — Telephone Encounter (Signed)
-----   Message from [name], FNP sent at 05/22/2016  7:35 PM EDT ----- Vitamin D level was low. Vitamin D helps to keep bones strong. You were prescribed ergocalciferol (capsules) to increase your vitamin-d level. Once finished start taking OTC vitamin d supplement with 800 international units (IU) of vitamin-d per day. Recheck lab in 3 months.  Lipid levels were elevated. This can increase your risk of heart disease. Your dose of atorvastatin has been increased. Recheck  lipid lab in 3 months.  Thyroid function normal Kidney function normal Labs normal

## 2016-05-23 NOTE — Telephone Encounter (Signed)
CMA call to go over lab results  Patient verify DOB  Patient was aware and understood   

## 2016-05-31 ENCOUNTER — Other Ambulatory Visit: Payer: Self-pay | Admitting: Family Medicine

## 2016-06-05 ENCOUNTER — Encounter: Payer: Self-pay | Admitting: Family Medicine

## 2016-06-11 ENCOUNTER — Ambulatory Visit: Payer: Medicaid Other | Attending: Family Medicine | Admitting: Family Medicine

## 2016-06-11 VITALS — BP 158/89 | HR 97 | Temp 98.2°F | Resp 18 | Ht 69.0 in | Wt 320.6 lb

## 2016-06-11 DIAGNOSIS — I10 Essential (primary) hypertension: Secondary | ICD-10-CM | POA: Insufficient documentation

## 2016-06-11 DIAGNOSIS — Z9114 Patient's other noncompliance with medication regimen: Secondary | ICD-10-CM | POA: Insufficient documentation

## 2016-06-11 DIAGNOSIS — F329 Major depressive disorder, single episode, unspecified: Secondary | ICD-10-CM | POA: Diagnosis not present

## 2016-06-11 DIAGNOSIS — Z794 Long term (current) use of insulin: Secondary | ICD-10-CM | POA: Diagnosis not present

## 2016-06-11 DIAGNOSIS — E039 Hypothyroidism, unspecified: Secondary | ICD-10-CM

## 2016-06-11 DIAGNOSIS — E1165 Type 2 diabetes mellitus with hyperglycemia: Secondary | ICD-10-CM | POA: Diagnosis not present

## 2016-06-11 DIAGNOSIS — L853 Xerosis cutis: Secondary | ICD-10-CM

## 2016-06-11 DIAGNOSIS — E559 Vitamin D deficiency, unspecified: Secondary | ICD-10-CM | POA: Insufficient documentation

## 2016-06-11 DIAGNOSIS — E1140 Type 2 diabetes mellitus with diabetic neuropathy, unspecified: Secondary | ICD-10-CM | POA: Diagnosis not present

## 2016-06-11 DIAGNOSIS — E782 Mixed hyperlipidemia: Secondary | ICD-10-CM | POA: Insufficient documentation

## 2016-06-11 LAB — POCT URINALYSIS DIPSTICK
Bilirubin, UA: NEGATIVE
Blood, UA: NEGATIVE
Glucose, UA: 500
Ketones, UA: NEGATIVE
LEUKOCYTES UA: NEGATIVE
NITRITE UA: NEGATIVE
PH UA: 6 (ref 5.0–8.0)
PROTEIN UA: 100
Spec Grav, UA: 1.02 (ref 1.030–1.035)
Urobilinogen, UA: 0.2 (ref ?–2.0)

## 2016-06-11 LAB — GLUCOSE, POCT (MANUAL RESULT ENTRY)
POC Glucose: 274 mg/dl — AB (ref 70–99)
POC Glucose: 295 mg/dl — AB (ref 70–99)

## 2016-06-11 LAB — POCT GLYCOSYLATED HEMOGLOBIN (HGB A1C): Hemoglobin A1C: 9.4

## 2016-06-11 MED ORDER — INSULIN GLARGINE 300 UNIT/ML ~~LOC~~ SOPN: 40.0000 [IU] | PEN_INJECTOR | Freq: Every day | SUBCUTANEOUS | 0 refills | Status: AC

## 2016-06-11 MED ORDER — EUCERIN EX CREA: TOPICAL_CREAM | CUTANEOUS | 0 refills | Status: AC | PRN

## 2016-06-11 MED ORDER — DAPAGLIFLOZIN PROPANEDIOL 10 MG PO TABS: 10.0000 mg | ORAL_TABLET | Freq: Every day | ORAL | 0 refills | Status: AC

## 2016-06-11 MED ORDER — LEVOTHYROXINE SODIUM 75 MCG PO TABS: 75.0000 ug | ORAL_TABLET | Freq: Every day | ORAL | 0 refills | Status: AC

## 2016-06-11 MED ORDER — INSULIN ASPART 100 UNIT/ML ~~LOC~~ SOLN
10.0000 [IU] | Freq: Once | SUBCUTANEOUS | Status: AC
  Administered 2016-06-11: 10 [IU] via SUBCUTANEOUS

## 2016-06-11 MED FILL — VIT D2 1.25 MG (50,000 UNIT: 1.25 MG | 28 days supply | Qty: 4 | Fill #0

## 2016-06-11 MED FILL — LEVOTHYROXINE 75 MCG TABLET: 75 | 30 days supply | Qty: 30 | Fill #0

## 2016-06-11 NOTE — Patient Instructions (Addendum)
Schedule follow up with PCP in 2 months. Bring glucose meter to follow up appointment with clinical pharmacist.  Follow up with endocrinologist for Diabetes.  Type 2 Diabetes Mellitus, Self Care, Adult When you have type 2 diabetes (type 2 diabetes mellitus), you must keep your blood sugar (glucose) under control. You can do this with:  Nutrition.  Exercise.  Lifestyle changes.  Medicines or insulin, if needed.  Support from your doctors and others. How do I manage my blood sugar?  Check your blood sugar level every day, as often as told.  Call your doctor if your blood sugar is above your goal numbers for 2 tests in a row.  Have your A1c (hemoglobin A1c) level checked at least two times a year. Have it checked more often if your doctor tells you to. Your doctor will set treatment goals for you. Generally, you should have these blood sugar levels:  Before meals (preprandial): 80-130 mg/dL (4.4-7.2 mmol/L).  After meals (postprandial): lower than 180 mg/dL (10 mmol/L).  A1c level: less than 7%. What do I need to know about high blood sugar? High blood sugar is called hyperglycemia. Know the signs of high blood sugar. Signs may include:  Feeling:  Thirsty.  Hungry.  Very tired.  Needing to pee (urinate) more than usual.  Blurry vision. What do I need to know about low blood sugar? Low blood sugar is called hypoglycemia. This is when blood sugar is at or below 70 mg/dL (3.9 mmol/L). Symptoms may include:  Feeling:  Hungry.  Worried or nervous (anxious).  Sweaty and clammy.  Confused.  Dizzy.  Sleepy.  Sick to your stomach (nauseous).  Having:  A fast heartbeat (palpitations).  A headache.  A change in your vision.  Jerky movements that you cannot control (seizure).  Nightmares.  Tingling or no feeling (numbness) around the mouth, lips, or tongue.  Having trouble with:  Talking.  Paying attention (concentrating).  Moving  (coordination).  Sleeping.  Shaking.  Passing out (fainting).  Getting upset easily (irritability). Treating low blood sugar   To treat low blood sugar, eat or drink something sugary right away. If you can think clearly and swallow safely, follow the 15:15 rule:  Take 15 grams of a fast-acting carb (carbohydrate). Some fast-acting carbs are:  1 tube of glucose gel.  3 sugar tablets (glucose pills).  6-8 pieces of hard candy.  4 oz (120 mL) of fruit juice.  4 oz (120 mL) regular (not diet) soda.  Check your blood sugar 15 minutes after you take the carb.  If your blood sugar is still at or below 70 mg/dL (3.9 mmol/L), take 15 grams of a carb again.  If your blood sugar does not go above 70 mg/dL (3.9 mmol/L) after 3 tries, get help right away.  After your blood sugar goes back to normal, eat a meal or a snack within 1 hour. Treating very low blood sugar  If your blood sugar is at or below 54 mg/dL (3 mmol/L), you have very low blood sugar (severe hypoglycemia). This is an emergency. Do not wait to see if the symptoms will go away. Get medical help right away. Call your local emergency services (911 in the [place] Do not drive yourself to the hospital. If you have very low blood sugar and you cannot eat or drink, you may need a glucagon shot (injection). A family member or friend should learn how to check your blood sugar and how to give you a glucagon shot.  Ask your doctor if you need to have a glucagon shot kit at home. What else is important to manage my diabetes? Medicine  Follow these instructions about insulin and diabetes medicines:  Take them as told by your doctor.  Adjust them as told by your doctor.  Do not run out of them. Having diabetes can raise your risk for other long-term conditions. These include heart or kidney disease. Your doctor may prescribe medicines to help prevent problems from diabetes. Food    Make healthy food choices. These  include:  Chicken, fish, egg whites, and beans.  Oats, whole wheat, bulgur, brown rice, quinoa, and millet.  Fresh fruits and vegetables.  Low-fat dairy products.  Nuts, avocado, olive oil, and canola oil.  Make a food plan with a specialist (dietitian).  Follow instructions from your doctor about what you cannot eat or drink.  Drink enough fluid to keep your pee (urine) clear or pale yellow.  Eat healthy snacks between healthy meals.  Keep track of carbs that you eat. Read food labels. Learn food serving sizes.  Follow your sick day plan when you cannot eat or drink normally. Make this plan with your doctor so it is ready to use. Activity   Exercise at least 3 times a week.  Do not go more than 2 days without exercising.  Talk with your doctor before you start a new exercise. Your doctor may need to adjust your insulin, medicines, or food. Lifestyle    Do not use any tobacco products. These include cigarettes, chewing tobacco, and e-cigarettes.If you need help quitting, ask your doctor.  Ask your doctor how much alcohol is safe for you.  Learn to deal with stress. If you need help with this, ask your doctor. Body care   Stay up to date with your shots (immunizations).  Have your eyes and feet checked by a doctor as often as told.  Check your skin and feet every day. Check for cuts, bruises, redness, blisters, or sores.  Brush your teeth and gums two times a day.  Floss at least one time a day.  Go to the dentist least one time every 6 months.  Stay at a healthy weight. General instructions    Take over-the-counter and prescription medicines only as told by your doctor.  Share your diabetes care plan with:  Your work or school.  People you live with.  Check your pee (urine) for ketones:  When you are sick.  As told by your doctor.  Carry a card or wear jewelry that says that you have diabetes.  Ask your doctor:  Do I need to meet with a  diabetes educator?  Where can I find a support group for people with diabetes?  Keep all follow-up visits as told by your doctor. This is important. Where to find more information: To learn more about diabetes, visit:  American Diabetes Association: www.diabetes.org  American Association of Diabetes Educators: www.diabeteseducator.org/patient-resources This information is not intended to replace advice given to you by your health care provider. Make sure you discuss any questions you have with your health care provider. Document Released: 06/13/2015 Document Revised: 07/28/2015 Document Reviewed: 03/25/2015 Elsevier Interactive Patient Education  2017 [employer].

## 2016-06-11 NOTE — Progress Notes (Signed)
Subjective:  Patient ID: Jason Hall, male    DOB: [birth year]  Age: 56 y.o. MRN: [id number]  CC: No chief complaint on file.   HPI Jason Hall presents for    DM:History of medication non-adherence. DM uncontrolled. He denies any blurred vision, abdominal pain or wounds. He is followed by a endocrinologist for his diabetes. 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 He wears corrective lenses. Reports CBG range between 154- 340. Reports CBG was 291 this am. He brings his glucometer with him to the office.   HTN: History of medication non-adherence. Denies any CP, SOB, or swelling of the BLE. Reports taking daily antihypertensive medications at different intervals throughout the day.   Depression: Reports being managed by [facility] in [place]. With follow up visits every 90 Days. Denies any SI/HI.    Outpatient Medications Prior to Visit  Medication Sig Dispense Refill  . acetaminophen (TYLENOL) 325 MG tablet Take 2 tablets (650 mg total) by mouth every 6 (six) hours as needed for moderate pain.    Marland Kitchen amLODipine (NORVASC) 10 MG tablet Take 1 tablet (10 mg total) by mouth daily. 30 tablet 5  . atorvastatin (LIPITOR) 80 MG tablet Take 1 tablet (80 mg total) by mouth daily. 90 tablet 0  . Blood Glucose Monitoring Suppl (TRUE METRIX METER) w/Device KIT USE AS INSTRUCTED 1 kit 0  . busPIRone (BUSPAR) 10 MG tablet Take 20 mg by mouth 3 (three) times daily.    Marland Kitchen glucose blood test strip Use as instructed 100 each 12  . hydrochlorothiazide (HYDRODIURIL) 50 MG tablet Take 1 tablet (50 mg total) by mouth daily. 30 tablet 2  . insulin NPH-regular Human (NOVOLIN 70/30) (70-30) 100 UNIT/ML injection 85 minutes before breakfast and 85 before 90 mL 3  . Insulin Pen Needle (ULTICARE MICRO PEN NEEDLES) 32G X 4 MM MISC Give insulin twice per day for E11.9 100 each 12  . Insulin Syringe-Needle U-100 (INSULIN SYRINGE 1CC/28G) 28G X 1/2" 1 ML MISC Used as instructed by PCP 1 each 11  .  lisinopril (PRINIVIL,ZESTRIL) 40 MG tablet Take 1 tablet (40 mg total) by mouth daily. 30 tablet 2  . metFORMIN (GLUCOPHAGE-XR) 500 MG 24 hr tablet TAKE 2 TABLETS BY MOUTH DAILY WITH SUPPER. 60 tablet 3  . metoCLOPramide (REGLAN) 10 MG tablet Take 1 tablet (10 mg total) by mouth 3 (three) times daily before meals. 90 tablet 0  . Multiple Vitamin (MULTIVITAMIN WITH MINERALS) TABS tablet Take 1 tablet by mouth daily.    Marland Kitchen omega-3 acid ethyl esters (LOVAZA) 1 g capsule Take 2 capsules (2 g total) by mouth 2 (two) times daily. To help lower triglycerides 360 capsule 3  . sertraline (ZOLOFT) 100 MG tablet Take 100 mg by mouth 2 (two) times daily.    . traZODone (DESYREL) 100 MG tablet Take 300 mg by mouth at bedtime.    . TRUEPLUS LANCETS 28G MISC USE AS INSTRUCTED 100 each 5  . FARXIGA 10 MG TABS tablet TAKE 1 TABLET BY MOUTH DAILY. 30 tablet 3  . Insulin Glargine (TOUJEO SOLOSTAR) 300 UNIT/ML SOPN Inject 40 Units into the skin daily. 9 pen 3  . levothyroxine (SYNTHROID, LEVOTHROID) 75 MCG tablet TAKE 1 TABLET BY MOUTH DAILY BEFORE BREAKFAST. MUST HAVE OFFICE VISIT FOR REFILLS. 30 tablet 2  . Vitamin D, Ergocalciferol, (DRISDOL) 50000 units CAPS capsule Take 1 capsule (50,000 Units total) by mouth every 7 (seven) days. 8 capsule 0  . insulin aspart (novoLOG) injection 20  Units      No facility-administered medications prior to visit.     ROS Review of Systems  Constitutional: Negative.   Eyes: Negative.   Respiratory: Negative.   Cardiovascular: Negative.   Gastrointestinal: Negative.   Endocrine: Negative.   Musculoskeletal: Negative.   Skin: Negative.   Neurological: Negative.   Psychiatric/Behavioral: Negative for suicidal ideas.       History of depression.    Objective:  BP (!) 158/89 (BP Location: Left Arm, Patient Position: Sitting, Cuff Size: Normal)   Pulse 97   Temp 98.2 F (36.8 C) (Oral)   Resp 18   Ht _0  (1.753 m)   Wt (!) 320 lb 9.6 oz (145.4 kg)   SpO2 95%   BMI  47.34 kg/m   BP/Weight 06/11/2016 03/10/6061 0/03/6008  Systolic BP 932 355 732  Diastolic BP 89 85 83  Wt. (Lbs) 320.6 318.6 315.4  BMI 47.34 45.71 46.58     Physical Exam  Constitutional: He is oriented to person, place, and time.  Eyes: Conjunctivae are normal. Pupils are equal, round, and reactive to light.  Cardiovascular: Normal rate, regular rhythm, normal heart sounds and intact distal pulses.   Pulmonary/Chest: Effort normal and breath sounds normal.  Abdominal: Soft. Bowel sounds are normal.  Neurological: He is alert and oriented to person, place, and time.  Skin: Skin is warm and dry.  Dry, cracked skin to bilateral feet. No wounds or open areas present.  Psychiatric: His affect is labile. He expresses no homicidal and no suicidal ideation. He expresses no suicidal plans and no homicidal plans.  Nursing note and vitals reviewed.  Diabetic Foot Exam - Simple   Simple Foot Form Diabetic Foot exam was performed with the following findings:  Yes 06/11/2016  2:29 PM  Visual Inspection No deformities, no ulcerations, no other skin breakdown bilaterally:  Yes Sensation Testing Intact to touch and monofilament testing bilaterally:  Yes Pulse Check Posterior Tibialis and Dorsalis pulse intact bilaterally:  Yes Comments Bilateral feet appear dry and cracked. No ulcerations or open areas present. Toenails are hard and elongated.     Assessment & Plan:   Problem List Items Addressed This Visit      Cardiovascular and Mediastinum   Hypertension   -Reports not taking his BP medications as prescribed. Not taking QD antihypertensive medication       altogether.   -Unsure of how well controlled his BP are due to him not taking all of them prior to office visit.   -Instructed to take medications as prescribed.    -Recommend follow up with clinical pharmacist in 2 weeks.    Relevant Orders   Lipid Panel (Completed)   Microalbumin/Creatinine Ratio, Urine (Completed)     Endocrine     Hypothyroidism   Relevant Medications   levothyroxine (SYNTHROID, LEVOTHROID) 75 MCG tablet    Other Visit Diagnoses    Type 2 diabetes mellitus with diabetic neuropathy, with long-term current use of insulin ([place])    -  Primary   -He is still non-compliant with taking blood glucose everyday. Brings glucometer in office with     blood glucose checks averaging 4 to 6 times per month.    -His DM is primarily managed by his endocrinologist. Pt. reports not seeing the need in being managed    by PCP and endocrinologist for DM and did not follow up with endocrinology.   -Given history of the complexity of his uncontrolled DM and frequent non-adherence. Recommend  he continue to be managed by his endocrinologist for DM.   -Until he is able to see his endocrinologist will reorder his [place] for better glucose control with     follow up CBG & HTN check with clinical pharmacist 2 weeks.   -Encouraged to take CBG consistently TID and bring glucometer to next visit.     -Schedule follow up with PCP in 2 months.    Relevant Medications   insulin aspart (novoLOG) injection 10 Units (Completed)   Insulin Glargine (TOUJEO SOLOSTAR) 300 UNIT/ML SOPN   dapagliflozin propanediol (FARXIGA) 10 MG TABS tablet   Other Relevant Orders   POCT glycosylated hemoglobin (Hb A1C) (Completed)   POCT glucose (manual entry) (Completed)   Lipid Panel (Completed)   Hepatic Function Panel (Completed)   Urinalysis Dipstick (Completed)   Ambulatory referral to Endocrinology   Microalbumin/Creatinine Ratio, Urine (Completed)   Glucose (CBG) (Completed)   Mixed hyperlipidemia       Relevant Orders   Lipid Panel (Completed)   Hepatic Function Panel (Completed)   Vitamin D deficiency       Relevant Orders   Vitamin D, 25-hydroxy (Completed)   Dry skin       Relevant Medications   Skin Protectants, Misc. (EUCERIN) cream      Meds ordered this encounter  Medications  . insulin aspart (novoLOG) injection 10  Units  . Insulin Glargine (TOUJEO SOLOSTAR) 300 UNIT/ML SOPN    Sig: Inject 40 Units into the skin daily.    Dispense:  9 pen    Refill:  0    Order Specific Question:   Supervising Provider    Answer:   Tresa Garter [id number]  . dapagliflozin propanediol (FARXIGA) 10 MG TABS tablet    Sig: Take 10 mg by mouth daily.    Dispense:  90 tablet    Refill:  0    Order Specific Question:   Supervising Provider    Answer:   Tresa Garter [id number]  . Skin Protectants, Misc. (EUCERIN) cream    Sig: Apply topically as needed for dry skin.    Dispense:  454 g    Refill:  0    Order Specific Question:   Supervising Provider    Answer:   Tresa Garter [id number]  . levothyroxine (SYNTHROID, LEVOTHROID) 75 MCG tablet    Sig: Take 1 tablet (75 mcg total) by mouth daily before breakfast.    Dispense:  90 tablet    Refill:  0    Order Specific Question:   Supervising Provider    Answer:   Tresa Garter [[id number]]    Follow-up: Return in about 2 weeks (around 06/25/2016) for BP & CBG check with clinical pharmacist.   Alfonse Spruce FNP

## 2016-06-11 NOTE — Progress Notes (Signed)
Patient is here for DM & HTN  Patient has been without med for 2 weeks  Patient has eaten for today  Patient denies pain for today

## 2016-06-12 LAB — HEPATIC FUNCTION PANEL
ALT: 24 IU/L (ref 0–44)
AST: 17 IU/L (ref 0–40)
Albumin: 4.2 g/dL (ref 3.5–5.5)
Alkaline Phosphatase: 84 IU/L (ref 39–117)
BILIRUBIN TOTAL: 0.2 mg/dL (ref 0.0–1.2)
BILIRUBIN, DIRECT: 0.1 mg/dL (ref 0.00–0.40)
Total Protein: 7.3 g/dL (ref 6.0–8.5)

## 2016-06-12 LAB — LIPID PANEL
Chol/HDL Ratio: 4.8 ratio (ref 0.0–5.0)
Cholesterol, Total: 211 mg/dL — ABNORMAL HIGH (ref 100–199)
HDL: 44 mg/dL (ref >39)
LDL Calculated: 113 mg/dL — ABNORMAL HIGH (ref 0–99)
TRIGLYCERIDES: 268 mg/dL — AB (ref 0–149)
VLDL Cholesterol Cal: 54 mg/dL — ABNORMAL HIGH (ref 5–40)

## 2016-06-12 LAB — VITAMIN D 25 HYDROXY (VIT D DEFICIENCY, FRACTURES): VIT D 25 HYDROXY: 11.9 ng/mL — AB (ref 30.0–100.0)

## 2016-06-12 LAB — MICROALBUMIN / CREATININE URINE RATIO
CREATININE, UR: 37.7 mg/dL
MICROALBUM., U, RANDOM: 330.2 ug/mL
Microalb/Creat Ratio: 875.9 mg/g creat — ABNORMAL HIGH (ref 0.0–30.0)

## 2016-06-13 ENCOUNTER — Other Ambulatory Visit: Payer: Self-pay | Admitting: Family Medicine

## 2016-06-13 DIAGNOSIS — E782 Mixed hyperlipidemia: Secondary | ICD-10-CM

## 2016-06-13 DIAGNOSIS — Z9229 Personal history of other drug therapy: Secondary | ICD-10-CM

## 2016-06-13 DIAGNOSIS — R809 Proteinuria, unspecified: Secondary | ICD-10-CM

## 2016-06-13 DIAGNOSIS — E559 Vitamin D deficiency, unspecified: Secondary | ICD-10-CM

## 2016-06-13 MED ORDER — VITAMIN D (ERGOCALCIFEROL) 1.25 MG (50000 UNIT) PO CAPS: 50000.0000 [IU] | ORAL_CAPSULE | ORAL | 0 refills | Status: AC

## 2016-06-13 NOTE — Telephone Encounter (Signed)
-----   Message from Lizbeth Bark, FNP sent at 06/13/2016  4:43 AM EDT ----- Lipid levels were elevated. This can increase your risk of heart disease overtime. Consistently take your atorvastatin daily. Start eating a diet low in saturated fat. Limit your intake of fried foods, red meats, and whole milk. Increase physical activity. Recheck lab  in 3 months.  Liver function normal Vitamin D level was low. Vitamin D helps to keep bones strong. You were prescribed ergocalciferol (capsules) to increase your vitamin-d level. Once finished start taking OTC vitamin d supplement with 800 international units (IU) of vitamin-d per day. Recheck in 3 months.  Microalbumin/creatinine ratio level was elevated. This tests for protein in your urine that could indicate early signs of kidney damage. Take your BP medications consistently. Recheck in 3 months.

## 2016-06-13 NOTE — Telephone Encounter (Signed)
-----   Message from Mandesia R Hairston, FNP sent at 06/13/2016  4:43 AM EDT ----- Lipid levels were elevated. This can increase your risk of heart disease overtime. Consistently take your atorvastatin daily. Start eating a diet low in saturated fat. Limit your intake of fried foods, red meats, and whole milk. Increase physical activity. Recheck lab  in 3 months.  Liver function normal Vitamin D level was low. Vitamin D helps to keep bones strong. You were prescribed ergocalciferol (capsules) to increase your vitamin-d level. Once finished start taking OTC vitamin d supplement with 800 international units (IU) of vitamin-d per day. Recheck in 3 months.  Microalbumin/creatinine ratio level was elevated. This tests for protein in your urine that could indicate early signs of kidney damage. Take your BP medications consistently. Recheck in 3 months.  

## 2016-06-13 NOTE — Telephone Encounter (Signed)
CMA call to inform patient about lab results  Patient did not answer but somebody else answer the phone just left a message where I was calling from & to give me a call back  to go over lab results

## 2016-06-13 NOTE — Telephone Encounter (Signed)
CMA tried second attempt to go over lab results  Patient was not at home left a message stating to call back

## 2016-07-04 ENCOUNTER — Encounter: Payer: Self-pay | Admitting: Sports Medicine

## 2016-07-04 ENCOUNTER — Ambulatory Visit (INDEPENDENT_AMBULATORY_CARE_PROVIDER_SITE_OTHER): Payer: No Typology Code available for payment source | Admitting: Sports Medicine

## 2016-07-04 DIAGNOSIS — L853 Xerosis cutis: Secondary | ICD-10-CM

## 2016-07-04 DIAGNOSIS — M79672 Pain in left foot: Secondary | ICD-10-CM

## 2016-07-04 DIAGNOSIS — E1142 Type 2 diabetes mellitus with diabetic polyneuropathy: Secondary | ICD-10-CM

## 2016-07-04 DIAGNOSIS — B351 Tinea unguium: Secondary | ICD-10-CM

## 2016-07-04 DIAGNOSIS — M79671 Pain in right foot: Secondary | ICD-10-CM
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
## 2016-07-04 NOTE — Progress Notes (Signed)
   Subjective:    Patient ID: Jason Hall, male    DOB: 11/01/1960, 56 y.o.   MRN: [id number]  HPI   I am a diabetic and my A1c is 8.9 and I have some thick and discolored toenails and I am on insulin and my feet do get cold and swell and get numb and tingle and gets sore and tender some     Review of Systems  All other systems reviewed and are negative.      Objective:   Physical Exam        Assessment & Plan:

## 2016-10-03 ENCOUNTER — Ambulatory Visit: Payer: No Typology Code available for payment source | Admitting: Sports Medicine

## 2016-10-25 ENCOUNTER — Ambulatory Visit: Payer: No Typology Code available for payment source | Admitting: Sports Medicine

## 2016-11-06 ENCOUNTER — Ambulatory Visit (INDEPENDENT_AMBULATORY_CARE_PROVIDER_SITE_OTHER): Payer: Medicaid Other | Admitting: Podiatry

## 2016-11-06 DIAGNOSIS — B351 Tinea unguium: Secondary | ICD-10-CM | POA: Diagnosis not present

## 2016-11-06 DIAGNOSIS — E1142 Type 2 diabetes mellitus with diabetic polyneuropathy: Secondary | ICD-10-CM

## 2016-11-06 NOTE — Progress Notes (Signed)
   Subjective:    Patient ID: Jason Hall, male    DOB: 09-07-1960, 56 y.o.   MRN: [id number]  HPI 56 y.o. male presents for Diabetic foot care. Did not check his sugar this AM. Reports numbness and tingling in his feet. Reports burning in his feet. Denies other pedal issues.  Review of Systems     Objective:   Physical Exam There were no vitals filed for this visit. General AA&O x3. Normal mood and affect.  Vascular Dorsalis pedis and posterior tibial pulses present 1+ bilaterally  Capillary refill delayed ~ 5 seconds to all digits. Pedal hair growth absent.  Neurologic Epicritic sensation present bilaterally. Protective sensation with 5.07 monofilament  absent bilaterally. Vibratory sensation absent bilaterally.  Dermatologic No open lesions. Interspaces clear of maceration.  Normal skin temperature and turgor. Hyperkeratotic lesions: none bilaterally. Nails: brittle, onychomycosis, thickening  Orthopedic: No history of amputation. Normal lower extremity joint ROM without pain or crepitus.     Assessment & Plan:  Diabetes with DPN, Onychomycosis -Educated on diabetic footcare. Diabetic risk level 1 -Nails x10 debrided as below.  Procedure: Nail Debridement Rationale: Patient meets criteria for routine foot care due to 1 Class B, 2 Class C findings. Type of Debridement: manual, sharp debridement. Instrumentation: Nail nipper, rotary burr. Number of Nails: 10

## 2017-02-05 ENCOUNTER — Ambulatory Visit: Payer: Medicaid Other | Admitting: Podiatry

## 2017-02-05 DIAGNOSIS — E1151 Type 2 diabetes mellitus with diabetic peripheral angiopathy without gangrene: Secondary | ICD-10-CM

## 2017-02-05 DIAGNOSIS — M2042 Other hammer toe(s) (acquired), left foot: Secondary | ICD-10-CM

## 2017-02-05 DIAGNOSIS — M2041 Other hammer toe(s) (acquired), right foot: Secondary | ICD-10-CM

## 2017-02-05 DIAGNOSIS — B351 Tinea unguium: Secondary | ICD-10-CM

## 2017-02-05 DIAGNOSIS — E1142 Type 2 diabetes mellitus with diabetic polyneuropathy: Secondary | ICD-10-CM | POA: Diagnosis not present

## 2017-02-05 NOTE — Progress Notes (Signed)
  Subjective:  Patient ID: Jason Hall, male    DOB: 1960/11/07,  MRN: [id number]  Chief Complaint  Patient presents with  . Diabetes    Diabetic foot exam   wants and Rx for diabetic shoes has Medicaid   . Nail Problem    nail care    56 y.o. male returns for diabetic foot care. Requests DM shoe rx. Endorses numbness and tingling in her feet. Endorses burning in his feet.  Objective:  There were no vitals filed for this visit. General AA&O x3. Normal mood and affect.  Vascular Dorsalis pedis pulses present 1+ bilaterally  Posterior tibial pulses present 1+ bilaterally  Capillary refill delayed ~ 5 seconds to all digits. Pedal hair growth absent.  Neurologic Epicritic sensation present bilaterally. Protective sensation with 5.07 monofilament  absent bilaterally. Vibratory sensation absent bilaterally.  Dermatologic No open lesions. Interspaces clear of maceration.  Normal skin temperature and turgor. Hyperkeratotic lesions: none bilaterally. Nails: brittle, discoloration dark yellow, onychomycosis, thickening  Orthopedic: No history of amputation. MMT 5/5 in dorsiflexion, plantarflexion, inversion, and eversion. Normal lower extremity joint ROM without pain or crepitus.   Assessment & Plan:  Patient was evaluated and treated and all questions answered.  Diabetes with DPN, Onychomycosis -Educated on diabetic footcare. Diabetic risk level 1 -At risk foot care provided as below.  At risk DM with hammertoes -Wound benefit from DM shoes. Rx written.   Procedure: Nail Debridement Rationale: Patient meets criteria for routine foot care due to 1 Class B, 2 Class C findings. Type of Debridement: manual, sharp debridement. Instrumentation: Nail nipper, rotary burr. Number of Nails: 10  No Follow-up on file.

## 2017-05-07 ENCOUNTER — Ambulatory Visit (INDEPENDENT_AMBULATORY_CARE_PROVIDER_SITE_OTHER): Payer: Medicaid Other | Admitting: Podiatry

## 2017-05-07 ENCOUNTER — Encounter: Payer: Self-pay | Admitting: Podiatry

## 2017-05-07 DIAGNOSIS — E1142 Type 2 diabetes mellitus with diabetic polyneuropathy: Secondary | ICD-10-CM | POA: Diagnosis not present

## 2017-05-07 DIAGNOSIS — B351 Tinea unguium: Secondary | ICD-10-CM

## 2017-05-07 NOTE — Progress Notes (Signed)
  Subjective:  Patient ID: Jason Hall, male    DOB: 1960/10/11,  MRN: [id number]  No chief complaint on file.  57 y.o. male returns for diabetic foot care. Denies new issues. Did not get DM shoes yet.  Objective:  There were no vitals filed for this visit. General AA&O x3. Normal mood and affect.  Vascular Dorsalis pedis pulses present 1+ bilaterally  Posterior tibial pulses present 1+ bilaterally  Capillary refill delayed ~ 5 seconds to all digits. Pedal hair growth absent  Neurologic Epicritic sensation present bilaterally. Protective sensation with 5.07 monofilament  absent bilaterally. Vibratory sensation absent bilaterally.  Dermatologic No open lesions. Interspaces clear of maceration.  Normal skin temperature and turgor. Hyperkeratotic lesions: none bilaterally. Nails: brittle, discoloration yellow, onychomycosis, thickening  Orthopedic: No history of amputation. MMT 5/5 in dorsiflexion, plantarflexion, inversion, and eversion. Normal lower extremity joint ROM without pain or crepitus.   Assessment & Plan:  Patient was evaluated and treated and all questions answered.  Diabetes with DPN, Onychomycosis -Educated on diabetic footcare. Diabetic risk level 1 -At risk foot care provided as below.  Procedure: Nail Debridement Rationale: Patient meets criteria for routine foot care due to Class C findings. Type of Debridement: manual, sharp debridement. Instrumentation: Nail nipper, rotary burr. Number of Nails: 10  Return in about 3 months (around 08/07/2017) for Diabetic Foot Care.

## 2017-08-13 ENCOUNTER — Ambulatory Visit: Payer: Medicaid Other | Admitting: Podiatry

## 2017-09-16 ENCOUNTER — Ambulatory Visit: Payer: Medicaid Other | Admitting: Podiatry

## 2021-07-03 DEATH — deceased

## 2024-02-26 ENCOUNTER — Other Ambulatory Visit (HOSPITAL_COMMUNITY): Payer: Self-pay
